# Patient Record
Sex: Male | Born: 1970 | Race: White | Hispanic: No | Marital: Married
Health system: Southern US, Community
[De-identification: ages and names within clinical notes are randomized; demographics above are authoritative.]

## PROBLEM LIST (undated history)

## (undated) DIAGNOSIS — B019 Varicella without complication: Secondary | ICD-10-CM

## (undated) HISTORY — PX: OTHER SURGICAL HISTORY: SHX169

## (undated) HISTORY — PX: KNEE ARTHROSCOPY: SHX127

## (undated) HISTORY — DX: Varicella without complication: B01.9

---
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
## 2018-01-23 ENCOUNTER — Ambulatory Visit: Payer: BC Managed Care – PPO | Admitting: Family Medicine

## 2018-01-23 ENCOUNTER — Encounter: Payer: Self-pay | Admitting: Family Medicine

## 2018-01-23 VITALS — BP 126/80 | HR 96 | Ht 67.0 in | Wt 213.0 lb

## 2018-01-23 DIAGNOSIS — D229 Melanocytic nevi, unspecified: Secondary | ICD-10-CM | POA: Diagnosis not present

## 2018-01-23 DIAGNOSIS — Z3009 Encounter for other general counseling and advice on contraception: Secondary | ICD-10-CM | POA: Insufficient documentation

## 2018-01-23 DIAGNOSIS — Z0001 Encounter for general adult medical examination with abnormal findings: Secondary | ICD-10-CM

## 2018-01-23 DIAGNOSIS — F5101 Primary insomnia: Secondary | ICD-10-CM | POA: Diagnosis not present

## 2018-01-23 DIAGNOSIS — Z125 Encounter for screening for malignant neoplasm of prostate: Secondary | ICD-10-CM | POA: Insufficient documentation

## 2018-01-23 DIAGNOSIS — Z0000 Encounter for general adult medical examination without abnormal findings: Secondary | ICD-10-CM | POA: Insufficient documentation

## 2018-01-23 DIAGNOSIS — Z1211 Encounter for screening for malignant neoplasm of colon: Secondary | ICD-10-CM | POA: Insufficient documentation

## 2018-01-23 NOTE — Progress Notes (Signed)
Subjective:  Patient ID: Richard Reynolds, male    DOB: 1970-12-16  Age: 47 y.o. MRN: [id number]  CC: Establish Care   HPI Richard Reynolds presents for establishment of care.  He is nonfasting today.  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  He lives with his wife and his 57 and5 year old children.  Oldest son is at we will studying drafting.  He is quite active he goes to the gym 5-6 days a week.  He does not smoke or use illicit drugs.  He drinks occasional beers on the weekends.  He uses trazodone 100 mg nightly for sleep.  This medicine is been quite effective for him.  He is taking it for some time now.  His father is 23 in relatively good health.  His father recently had a problem with his right eye patient is not sure what that is.  Patient's mother is in her 37s and has problems with being overweight.  Patient has a brother who is status post melanoma.  Patient has a past medical history of pyloric stenosis and so this is brother. History Richard Reynolds has no past medical history on file.   He has no past surgical history on file.   His family history is not on file.He reports that  has never smoked. he has never used smokeless tobacco. His alcohol and drug histories are not on file.  Outpatient Medications Prior to Visit  Medication Sig Dispense Refill  . traZODone (DESYREL) 100 MG tablet Take 1 tablet by mouth daily.     No facility-administered medications prior to visit.     ROS Review of Systems  Constitutional: Negative for chills, fatigue and fever.  HENT: Negative for congestion, postnasal drip and rhinorrhea.   Eyes: Negative for photophobia and visual disturbance.  Respiratory: Negative for cough and wheezing.   Cardiovascular: Negative.   Gastrointestinal: Negative.   Endocrine: Negative for polyphagia and polyuria.  Genitourinary: Negative.   Skin: Negative.   Allergic/Immunologic: Negative for immunocompromised state.  Neurological: Negative for  weakness and headaches.  Hematological: Does not bruise/bleed easily.  Psychiatric/Behavioral: Positive for sleep disturbance.    Objective:  BP 126/80 (BP Location: Left Arm, Patient Position: Sitting, Cuff Size: Normal)   Pulse 96   Ht 5\' 7"  (1.702 m)   Wt 213 lb (96.6 kg)   SpO2 96%   BMI 33.36 kg/m   Physical Exam  Constitutional: He appears well-developed and well-nourished. No distress.  HENT:  Head: Normocephalic and atraumatic.  Right Ear: External ear normal.  Left Ear: External ear normal.  Mouth/Throat: Oropharynx is clear and moist. No oropharyngeal exudate.  Eyes: Conjunctivae are normal. Pupils are equal, round, and reactive to light. Right eye exhibits no discharge. Left eye exhibits no discharge. No scleral icterus.  Neck: Neck supple. No JVD present. No tracheal deviation present. No thyromegaly present.  Cardiovascular: Normal rate, regular rhythm and normal heart sounds.  Pulmonary/Chest: Effort normal and breath sounds normal.  Abdominal: Soft. Bowel sounds are normal. He exhibits no distension. There is no hepatosplenomegaly, splenomegaly or hepatomegaly. There is no tenderness. There is no rebound, no guarding and no CVA tenderness. No hernia. Hernia confirmed negative in the ventral area, confirmed negative in the right inguinal area and confirmed negative in the left inguinal area.    Genitourinary: Testes normal and penis normal. Right testis shows no mass, no swelling and no tenderness. Right testis is descended. Left testis shows no mass, no swelling and no tenderness.  Left testis is descended. Circumcised. No hypospadias, penile erythema or penile tenderness. No discharge found.  Lymphadenopathy:    He has no cervical adenopathy.       Right: No inguinal adenopathy present.       Left: No inguinal adenopathy present.  Skin: Skin is warm and dry. He is not diaphoretic.  Psychiatric: He has a normal mood and affect. His behavior is normal.       Assessment & Plan:   Richard Reynolds was seen today for establish care.  Diagnoses and all orders for this visit:  Atypical mole -     Ambulatory referral to Dermatology  Primary insomnia  Encounter for health maintenance examination with abnormal findings -     CBC; Future -     Comprehensive metabolic panel; Future -     HIV antibody; Future -     Lipid panel; Future -     TSH; Future -     Urinalysis, Routine w reflex microscopic; Future   I am having Richard Reynolds maintain his traZODone.  No orders of the defined types were placed in this encounter.    Follow-up: Return in about 6 months (around 07/23/2018), or if symptoms worsen or fail to improve.  Libby Maw, MD

## 2018-01-23 NOTE — Patient Instructions (Addendum)
Insomnia Insomnia is a sleep disorder that makes it difficult to fall asleep or to stay asleep. Insomnia can cause tiredness (fatigue), low energy, difficulty concentrating, mood swings, and poor performance at work or school. There are three different ways to classify insomnia:  Difficulty falling asleep.  Difficulty staying asleep.  Waking up too early in the morning.  Any type of insomnia can be long-term (chronic) or short-term (acute). Both are common. Short-term insomnia usually lasts for three months or less. Chronic insomnia occurs at least three times a week for longer than three months. What are the causes? Insomnia may be caused by another condition, situation, or substance, such as:  Anxiety.  Certain medicines.  Gastroesophageal reflux disease (GERD) or other gastrointestinal conditions.  Asthma or other breathing conditions.  Restless legs syndrome, sleep apnea, or other sleep disorders.  Chronic pain.  Menopause. This may include hot flashes.  Stroke.  Abuse of alcohol, tobacco, or illegal drugs.  Depression.  Caffeine.  Neurological disorders, such as Alzheimer disease.  An overactive thyroid (hyperthyroidism).  The cause of insomnia may not be known. What increases the risk? Risk factors for insomnia include:  Gender. Women are more commonly affected than men.  Age. Insomnia is more common as you get older.  Stress. This may involve your professional or personal life.  Income. Insomnia is more common in people with lower income.  Lack of exercise.  Irregular work schedule or night shifts.  Traveling between different time zones.  What are the signs or symptoms? If you have insomnia, trouble falling asleep or trouble staying asleep is the main symptom. This may lead to other symptoms, such as:  Feeling fatigued.  Feeling nervous about going to sleep.  Not feeling rested in the morning.  Having trouble concentrating.  Feeling  irritable, anxious, or depressed.  How is this treated? Treatment for insomnia depends on the cause. If your insomnia is caused by an underlying condition, treatment will focus on addressing the condition. Treatment may also include:  Medicines to help you sleep.  Counseling or therapy.  Lifestyle adjustments.  Follow these instructions at home:  Take medicines only as directed by your health care provider.  Keep regular sleeping and waking hours. Avoid naps.  Keep a sleep diary to help you and your health care provider figure out what could be causing your insomnia. Include: ? When you sleep. ? When you wake up during the night. ? How well you sleep. ? How rested you feel the next day. ? Any side effects of medicines you are taking. ? What you eat and drink.  Make your bedroom a comfortable place where it is easy to fall asleep: ? Put up shades or special blackout curtains to block light from outside. ? Use a white noise machine to block noise. ? Keep the temperature cool.  Exercise regularly as directed by your health care provider. Avoid exercising right before bedtime.  Use relaxation techniques to manage stress. Ask your health care provider to suggest some techniques that may work well for you. These may include: ? Breathing exercises. ? Routines to release muscle tension. ? Visualizing peaceful scenes.  Cut back on alcohol, caffeinated beverages, and cigarettes, especially close to bedtime. These can disrupt your sleep.  Do not overeat or eat spicy foods right before bedtime. This can lead to digestive discomfort that can make it hard for you to sleep.  Limit screen use before bedtime. This includes: ? Watching TV. ? Using your smartphone, tablet, and   computer.  Stick to a routine. This can help you fall asleep faster. Try to do a quiet activity, brush your teeth, and go to bed at the same time each night.  Get out of bed if you are still awake after 15 minutes  of trying to sleep. Keep the lights down, but try reading or doing a quiet activity. When you feel sleepy, go back to bed.  Make sure that you drive carefully. Avoid driving if you feel very sleepy.  Keep all follow-up appointments as directed by your health care provider. This is important. Contact a health care provider if:  You are tired throughout the day or have trouble in your daily routine due to sleepiness.  You continue to have sleep problems or your sleep problems get worse. Get help right away if:  You have serious thoughts about hurting yourself or someone else. This information is not intended to replace advice given to you by your health care provider. Make sure you discuss any questions you have with your health care provider. Document Released: 11/17/2000 Document Revised: 04/21/2016 Document Reviewed: 08/21/2014 Elsevier Interactive Patient Education  2018 [employer]  Mole A mole is a colored (pigmented) growth on the skin. Moles are very common. They are usually harmless, but some moles can become cancerous over time. What are the causes? Moles occur when pigmented skin cells grow together in clusters instead of spreading out in the skin as they normally do. The reason why the skin cells grow together in clusters is not known. What are the signs or symptoms? A mole may be:  [place] or black.  Flat or raised.  Smooth or wrinkled.  How is this diagnosed? A mole is diagnosed with a skin exam. If your health care provider thinks a mole may be cancerous, a piece of the mole will be removed for testing. How is this treated? Treatment is not needed unless a mole is cancerous. If a mole is cancerous, it will be removed. If a mole is causing pain or you do not like the way it looks, you may choose to have it removed. Follow these instructions at home:  Every month, look for new moles and check your existing moles for changes. This is important because a change in a mole  can mean that the mole has become cancerous. Look for changes in: ? Size. Look for moles that are more than  in (0.64 cm) wide (in diameter). ? Shape. Look for moles that are not round or oval. ? Borders. Look for moles that are not symmetrical. ? Color. Note that it is normal for moles to get darker during pregnancy or when you take birth control pills.  When you are outdoors, wear sunscreen with SPF 30 (sun protection factor 30) or higher. Reapply the sunscreen every 2-3 hours.  If you have a large number of moles, see a skin doctor (dermatologist) at least one time every year. Contact a health care provider if:  The size, shape, borders, or color of your mole change.  Your mole, or the skin near the mole, becomes painful, sore, red, or swollen.  Your mole: ? Develops more than one color. ? Itches or bleeds. ? Becomes scaly, sheds skin, or oozes fluid. ? Becomes flat or develops raised areas. ? Becomes hard or soft.  You develop a new mole. This information is not intended to replace advice given to you by your health care provider. Make sure you discuss any questions you have with your  health care provider. Document Released: 08/15/2001 Document Revised: 05/03/2016 Document Reviewed: 09/10/2015 Elsevier Interactive Patient Education  [employer].

## 2018-01-24 ENCOUNTER — Other Ambulatory Visit (INDEPENDENT_AMBULATORY_CARE_PROVIDER_SITE_OTHER): Payer: BC Managed Care – PPO

## 2018-01-24 DIAGNOSIS — Z0001 Encounter for general adult medical examination with abnormal findings: Secondary | ICD-10-CM

## 2018-01-24 LAB — COMPREHENSIVE METABOLIC PANEL
ALBUMIN: 4.3 g/dL (ref 3.5–5.2)
ALT: 18 U/L (ref 0–53)
AST: 23 U/L (ref 0–37)
Alkaline Phosphatase: 51 U/L (ref 39–117)
BUN: 15 mg/dL (ref 6–23)
CHLORIDE: 105 meq/L (ref 96–112)
CO2: 31 meq/L (ref 19–32)
Calcium: 9.5 mg/dL (ref 8.4–10.5)
Creatinine, Ser: 0.93 mg/dL (ref 0.40–1.50)
GFR: 93.67 mL/min (ref >60.00)
Glucose, Bld: 110 mg/dL — ABNORMAL HIGH (ref 70–99)
POTASSIUM: 4.1 meq/L (ref 3.5–5.1)
SODIUM: 141 meq/L (ref 135–145)
Total Bilirubin: 0.7 mg/dL (ref 0.2–1.2)
Total Protein: 6.7 g/dL (ref 6.0–8.3)

## 2018-01-24 LAB — LIPID PANEL
Cholesterol: 171 mg/dL (ref 0–200)
HDL: 56.1 mg/dL (ref >39.00)
LDL Cholesterol: 103 mg/dL — ABNORMAL HIGH (ref 0–99)
NonHDL: 114.4
Total CHOL/HDL Ratio: 3
Triglycerides: 59 mg/dL (ref 0.0–149.0)
VLDL: 11.8 mg/dL (ref 0.0–40.0)

## 2018-01-24 LAB — CBC
HEMATOCRIT: 45.9 % (ref 39.0–52.0)
HEMOGLOBIN: 15.4 g/dL (ref 13.0–17.0)
MCHC: 33.6 g/dL (ref 30.0–36.0)
MCV: 85.9 fl (ref 78.0–100.0)
Platelets: 207 10*3/uL (ref 150.0–400.0)
RBC: 5.34 Mil/uL (ref 4.22–5.81)
RDW: 13.1 % (ref 11.5–15.5)
WBC: 4.7 10*3/uL (ref 4.0–10.5)

## 2018-01-24 LAB — TSH: TSH: 1.03 u[IU]/mL (ref 0.35–4.50)

## 2018-01-25 ENCOUNTER — Encounter: Payer: Self-pay | Admitting: Family Medicine

## 2018-01-25 LAB — HIV ANTIBODY (ROUTINE TESTING W REFLEX): HIV 1&2 Ab, 4th Generation: NONREACTIVE

## 2018-05-01 ENCOUNTER — Other Ambulatory Visit: Payer: Self-pay | Admitting: Family Medicine

## 2018-05-01 MED ORDER — TRAZODONE HCL 100 MG PO TABS: 100.0000 mg | ORAL_TABLET | Freq: Every day | ORAL | 1 refills | Status: DC

## 2018-05-01 NOTE — Telephone Encounter (Signed)
Rx sent in

## 2018-05-01 NOTE — Telephone Encounter (Signed)
Copied from [place] [phone]. Topic: Quick Communication - Rx Refill/Question >> May 01, 2018 11:04 AM Lennox Solders wrote: Medication: trazodone  Has the patient contacted their pharmacy?no  (Agent: If yes, when and what did the pharmacy advise?) this med was originally prescribed by his old md  Preferred Pharmacy (with phone number or street name): [facility] [address] in [place] Agent: Please be advised that RX refills may take up to 3 business days. We ask that you follow-up with your pharmacy.

## 2018-10-14 ENCOUNTER — Encounter: Payer: Self-pay | Admitting: Family Medicine

## 2018-10-14 ENCOUNTER — Ambulatory Visit: Payer: BC Managed Care – PPO | Admitting: Family Medicine

## 2018-10-14 VITALS — BP 120/78 | HR 67 | Ht 67.0 in | Wt 220.0 lb

## 2018-10-14 DIAGNOSIS — F5101 Primary insomnia: Secondary | ICD-10-CM

## 2018-10-14 DIAGNOSIS — E6609 Other obesity due to excess calories: Secondary | ICD-10-CM | POA: Insufficient documentation

## 2018-10-14 DIAGNOSIS — D229 Melanocytic nevi, unspecified: Secondary | ICD-10-CM

## 2018-10-14 DIAGNOSIS — Z6834 Body mass index (BMI) 34.0-34.9, adult: Secondary | ICD-10-CM | POA: Diagnosis not present

## 2018-10-14 MED ORDER — TRAZODONE HCL 100 MG PO TABS: 100.0000 mg | ORAL_TABLET | Freq: Every day | ORAL | 2 refills | Status: DC

## 2018-10-14 NOTE — Patient Instructions (Signed)
Calorie Counting for Weight Loss Calories are units of energy. Your body needs a certain amount of calories from food to keep you going throughout the day. When you eat more calories than your body needs, your body stores the extra calories as fat. When you eat fewer calories than your body needs, your body burns fat to get the energy it needs. Calorie counting means keeping track of how many calories you eat and drink each day. Calorie counting can be helpful if you need to lose weight. If you make sure to eat fewer calories than your body needs, you should lose weight. Ask your health care provider what a healthy weight is for you. For calorie counting to work, you will need to eat the right number of calories in a day in order to lose a healthy amount of weight per week. A dietitian can help you determine how many calories you need in a day and will give you suggestions on how to reach your calorie goal.  A healthy amount of weight to lose per week is usually 1-2 lb (0.5-0.9 kg). This usually means that your daily calorie intake should be reduced by 500-750 calories.  Eating 1,200 - 1,500 calories per day can help most women lose weight.  Eating 1,500 - 1,800 calories per day can help most men lose weight.  What is my plan? My goal is to have __________ calories per day. If I have this many calories per day, I should lose around __________ pounds per week. What do I need to know about calorie counting? In order to meet your daily calorie goal, you will need to:  Find out how many calories are in each food you would like to eat. Try to do this before you eat.  Decide how much of the food you plan to eat.  Write down what you ate and how many calories it had. Doing this is called keeping a food log.  To successfully lose weight, it is important to balance calorie counting with a healthy lifestyle that includes regular activity. Aim for 150 minutes of moderate exercise (such as walking) or 75  minutes of vigorous exercise (such as running) each week. Where do I find calorie information?  The number of calories in a food can be found on a Nutrition Facts label. If a food does not have a Nutrition Facts label, try to look up the calories online or ask your dietitian for help. Remember that calories are listed per serving. If you choose to have more than one serving of a food, you will have to multiply the calories per serving by the amount of servings you plan to eat. For example, the label on a package of bread might say that a serving size is 1 slice and that there are 90 calories in a serving. If you eat 1 slice, you will have eaten 90 calories. If you eat 2 slices, you will have eaten 180 calories. How do I keep a food log? Immediately after each meal, record the following information in your food log:  What you ate. Don't forget to include toppings, sauces, and other extras on the food.  How much you ate. This can be measured in cups, ounces, or number of items.  How many calories each food and drink had.  The total number of calories in the meal.  Keep your food log near you, such as in a small notebook in your pocket, or use a mobile app or website. Some   programs will calculate calories for you and show you how many calories you have left for the day to meet your goal. What are some calorie counting tips?  Use your calories on foods and drinks that will fill you up and not leave you hungry: ? Some examples of foods that fill you up are nuts and nut butters, vegetables, lean proteins, and high-fiber foods like whole grains. High-fiber foods are foods with more than 5 g fiber per serving. ? Drinks such as sodas, specialty coffee drinks, alcohol, and juices have a lot of calories, yet do not fill you up.  Eat nutritious foods and avoid empty calories. Empty calories are calories you get from foods or beverages that do not have many vitamins or protein, such as candy, sweets, and  soda. It is better to have a nutritious high-calorie food (such as an avocado) than a food with few nutrients (such as a bag of chips).  Know how many calories are in the foods you eat most often. This will help you calculate calorie counts faster.  Pay attention to calories in drinks. Low-calorie drinks include water and unsweetened drinks.  Pay attention to nutrition labels for "low fat" or "fat free" foods. These foods sometimes have the same amount of calories or more calories than the full fat versions. They also often have added sugar, starch, or salt, to make up for flavor that was removed with the fat.  Find a way of tracking calories that works for you. Get creative. Try different apps or programs if writing down calories does not work for you. What are some portion control tips?  Know how many calories are in a serving. This will help you know how many servings of a certain food you can have.  Use a measuring cup to measure serving sizes. You could also try weighing out portions on a kitchen scale. With time, you will be able to estimate serving sizes for some foods.  Take some time to put servings of different foods on your favorite plates, bowls, and cups so you know what a serving looks like.  Try not to eat straight from a bag or box. Doing this can lead to overeating. Put the amount you would like to eat in a cup or on a plate to make sure you are eating the right portion.  Use smaller plates, glasses, and bowls to prevent overeating.  Try not to multitask (for example, watch TV or use your computer) while eating. If it is time to eat, sit down at a table and enjoy your food. This will help you to know when you are full. It will also help you to be aware of what you are eating and how much you are eating. What are tips for following this plan? Reading food labels  Check the calorie count compared to the serving size. The serving size may be smaller than what you are used to  eating.  Check the source of the calories. Make sure the food you are eating is high in vitamins and protein and low in saturated and trans fats. Shopping  Read nutrition labels while you shop. This will help you make healthy decisions before you decide to purchase your food.  Make a grocery list and stick to it. Cooking  Try to cook your favorite foods in a healthier way. For example, try baking instead of frying.  Use low-fat dairy products. Meal planning  Use more fruits and vegetables. Half of your plate should   be fruits and vegetables.  Include lean proteins like poultry and fish. How do I count calories when eating out?  Ask for smaller portion sizes.  Consider sharing an entree and sides instead of getting your own entree.  If you get your own entree, eat only half. Ask for a box at the beginning of your meal and put the rest of your entree in it so you are not tempted to eat it.  If calories are listed on the menu, choose the lower calorie options.  Choose dishes that include vegetables, fruits, whole grains, low-fat dairy products, and lean protein.  Choose items that are boiled, broiled, grilled, or steamed. Stay away from items that are buttered, battered, fried, or served with cream sauce. Items labeled "crispy" are usually fried, unless stated otherwise.  Choose water, low-fat milk, unsweetened iced tea, or other drinks without added sugar. If you want an alcoholic beverage, choose a lower calorie option such as a glass of wine or light beer.  Ask for dressings, sauces, and syrups on the side. These are usually high in calories, so you should limit the amount you eat.  If you want a salad, choose a garden salad and ask for grilled meats. Avoid extra toppings like bacon, cheese, or fried items. Ask for the dressing on the side, or ask for olive oil and vinegar or lemon to use as dressing.  Estimate how many servings of a food you are given. For example, a serving of  cooked rice is  cup or about the size of half a baseball. Knowing serving sizes will help you be aware of how much food you are eating at restaurants. The list below tells you how big or small some common portion sizes are based on everyday objects: ? 1 oz-4 stacked dice. ? 3 oz-1 deck of cards. ? 1 tsp-1 die. ? 1 Tbsp- a ping-pong ball. ? 2 Tbsp-1 ping-pong ball. ?  cup- baseball. ? 1 cup-1 baseball. Summary  Calorie counting means keeping track of how many calories you eat and drink each day. If you eat fewer calories than your body needs, you should lose weight.  A healthy amount of weight to lose per week is usually 1-2 lb (0.5-0.9 kg). This usually means reducing your daily calorie intake by 500-750 calories.  The number of calories in a food can be found on a Nutrition Facts label. If a food does not have a Nutrition Facts label, try to look up the calories online or ask your dietitian for help.  Use your calories on foods and drinks that will fill you up, and not on foods and drinks that will leave you hungry.  Use smaller plates, glasses, and bowls to prevent overeating. This information is not intended to replace advice given to you by your health care provider. Make sure you discuss any questions you have with your health care provider. Document Released: 11/20/2005 Document Revised: 10/20/2016 Document Reviewed: 10/20/2016 Elsevier Interactive Patient Education  2018 [employer].  Preventing Type 2 Diabetes Mellitus Type 2 diabetes (type 2 diabetes mellitus) is a long-term (chronic) disease that affects blood sugar (glucose) levels. Normally, a hormone called insulin allows glucose to enter cells in the body. The cells use glucose for energy. In type 2 diabetes, one or both of these problems may be present:  The body does not make enough insulin.  The body does not respond properly to insulin that it makes (insulin resistance).  Insulin resistance or lack of insulin  causes excess glucose to build up in the blood instead of going into cells. As a result, high blood glucose (hyperglycemia) develops, which can cause many complications. Being overweight or obese and having an inactive (sedentary) lifestyle can increase your risk for diabetes. Type 2 diabetes can be delayed or prevented by making certain nutrition and lifestyle changes. What nutrition changes can be made?  Eat healthy meals and snacks regularly. Keep a healthy snack with you for when you get hungry between meals, such as fruit or a handful of nuts.  Eat lean meats and proteins that are low in saturated fats, such as chicken, fish, egg whites, and beans. Avoid processed meats.  Eat plenty of fruits and vegetables and plenty of grains that have not been processed (whole grains). It is recommended that you eat: ? 1?2 cups of fruit every day. ? 2?3 cups of vegetables every day. ? 6?8 oz of whole grains every day, such as oats, whole wheat, bulgur, brown rice, quinoa, and millet.  Eat low-fat dairy products, such as milk, yogurt, and cheese.  Eat foods that contain healthy fats, such as nuts, avocado, olive oil, and canola oil.  Drink water throughout the day. Avoid drinks that contain added sugar, such as soda or sweet tea.  Follow instructions from your health care provider about specific eating or drinking restrictions.  Control how much food you eat at a time (portion size). ? Check food labels to find out the serving sizes of foods. ? Use a kitchen scale to weigh amounts of foods.  Saute or steam food instead of frying it. Cook with water or broth instead of oils or butter.  Limit your intake of: ? Salt (sodium). Have no more than 1 tsp (2,400 mg) of sodium a day. If you have heart disease or high blood pressure, have less than ? tsp (1,500 mg) of sodium a day. ? Saturated fat. This is fat that is solid at room temperature, such as butter or fat on meat. What lifestyle changes can be  made?  Activity  Do moderate-intensity physical activity for at least 30 minutes on at least 5 days of the week, or as much as told by your health care provider.  Ask your health care provider what activities are safe for you. A mix of physical activities may be best, such as walking, swimming, cycling, and strength training.  Try to add physical activity into your day. For example: ? Park in spots that are farther away than usual, so that you walk more. For example, park in a far corner of the parking lot when you go to the office or the grocery store. ? Take a walk during your lunch break. ? Use stairs instead of elevators or escalators. Weight Loss  Lose weight as directed. Your health care provider can determine how much weight loss is best for you and can help you lose weight safely.  If you are overweight or obese, you may be instructed to lose at least 5?7 % of your body weight. Alcohol and Tobacco   Limit alcohol intake to no more than 1 drink a day for nonpregnant women and 2 drinks a day for men. One drink equals 12 oz of beer, 5 oz of wine, or 1 oz of hard liquor.  Do not use any tobacco products, such as cigarettes, chewing tobacco, and e-cigarettes. If you need help quitting, ask your health care provider. Work With [place] Provider  Have your blood glucose tested regularly,  as told by your health care provider.  Discuss your risk factors and how you can reduce your risk for diabetes.  Get screening tests as told by your health care provider. You may have screening tests regularly, especially if you have certain risk factors for type 2 diabetes.  Make an appointment with a diet and nutrition specialist (registered dietitian). A registered dietitian can help you make a healthy eating plan and can help you understand portion sizes and food labels. Why are these changes important?  It is possible to prevent or delay type 2 diabetes and related health problems by  making lifestyle and nutrition changes.  It can be difficult to recognize signs of type 2 diabetes. The best way to avoid possible damage to your body is to take actions to prevent the disease before you develop symptoms. What can happen if changes are not made?  Your blood glucose levels may keep increasing. Having high blood glucose for a long time is dangerous. Too much glucose in your blood can damage your blood vessels, heart, kidneys, nerves, and eyes.  You may develop prediabetes or type 2 diabetes. Type 2 diabetes can lead to many chronic health problems and complications, such as: ? Heart disease. ? Stroke. ? Blindness. ? Kidney disease. ? Depression. ? Poor circulation in the feet and legs, which could lead to surgical removal (amputation) in severe cases. Where to find support:  Ask your health care provider to recommend a registered dietitian, diabetes educator, or weight loss program.  Look for local or online weight loss groups.  Join a gym, fitness club, or outdoor activity group, such as a walking club. Where to find more information: To learn more about diabetes and diabetes prevention, visit:  American Diabetes Association (ADA): www.diabetes.[employer] of Diabetes and Digestive and Kidney Diseases: FindSpin.nl  To learn more about healthy eating, visit:  The U.S. Department of Agriculture Scientist, research (physical sciences)), Choose My Plate: http://wiley-williams.com/  Office of Disease Prevention and Health Promotion (ODPHP), Dietary Guidelines: SurferLive.at  Summary  You can reduce your risk for type 2 diabetes by increasing your physical activity, eating healthy foods, and losing weight as directed.  Talk with your health care provider about your risk for type 2 diabetes. Ask about any blood tests or screening tests that you need to have. This information is not intended to replace advice given to you by your  health care provider. Make sure you discuss any questions you have with your health care provider. Document Released: 03/13/2016 Document Revised: 04/27/2016 Document Reviewed: 01/11/2016 Elsevier Interactive Patient Education  [employer].

## 2018-10-14 NOTE — Progress Notes (Signed)
Subjective:  Patient ID: Richard Reynolds, male    DOB: 08-03-1971  Age: 47 y.o. MRN: [id number]  CC: Follow-up   HPI Navdeep Halt presents for follow-up of his insomnia.  Patient continues to use the trazodone on a regular basis.  He takes the entire 100 mg pill.  Medication remains highly effective for him.  Expresses some frustration of her weight gain especially when he is going to the gym regularly.  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  He is married with 69 and 47 year old sons.  24 year old is planning on attending [employer].  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  Outpatient Medications Prior to Visit  Medication Sig Dispense Refill  . traZODone (DESYREL) 100 MG tablet Take 1 tablet (100 mg total) by mouth daily. 90 tablet 1   No facility-administered medications prior to visit.     ROS Review of Systems  Constitutional: Negative.   Eyes: Negative.   Respiratory: Negative.   Cardiovascular: Negative.   Gastrointestinal: Negative.   Skin: Positive for color change.  Neurological: Negative.   Hematological: Negative.   Psychiatric/Behavioral: Positive for sleep disturbance. Negative for behavioral problems and dysphoric mood. The patient is not nervous/anxious.     Objective:  BP 120/78 (BP Location: Left Arm, Patient Position: Sitting, Cuff Size: Normal)   Pulse 67   Ht 5\' 7"  (1.702 m)   Wt 220 lb (99.8 kg)   SpO2 95%   BMI 34.46 kg/m   BP Readings from Last 3 Encounters:  10/14/18 120/78  01/23/18 126/80    Wt Readings from Last 3 Encounters:  10/14/18 220 lb (99.8 kg)  01/23/18 213 lb (96.6 kg)    Physical Exam  Constitutional: He is oriented to person, place, and time. He appears well-developed and well-nourished. No distress.  HENT:  Head: Normocephalic and atraumatic.  Right Ear: External ear normal.  Left Ear: External ear  normal.  Mouth/Throat: Oropharynx is clear and moist. No oropharyngeal exudate.  Eyes: Pupils are equal, round, and reactive to light. Conjunctivae and EOM are normal. Right eye exhibits no discharge. Left eye exhibits no discharge.  Neck: Neck supple. No JVD present. No tracheal deviation present. No thyromegaly present.  Cardiovascular: Normal rate, regular rhythm and normal heart sounds.  Pulmonary/Chest: Effort normal and breath sounds normal.  Lymphadenopathy:    He has no cervical adenopathy.  Neurological: He is alert and oriented to person, place, and time.  Skin: Skin is warm and dry.     Psychiatric: He has a normal mood and affect. His behavior is normal.    Lab Results  Component Value Date   WBC 4.7 01/24/2018   HGB 15.4 01/24/2018   HCT 45.9 01/24/2018   PLT 207.0 01/24/2018   GLUCOSE 110 (H) 01/24/2018   CHOL 171 01/24/2018   TRIG 59.0 01/24/2018   HDL 56.10 01/24/2018   LDLCALC 103 (H) 01/24/2018   ALT 18 01/24/2018   AST 23 01/24/2018   NA 141 01/24/2018   K 4.1 01/24/2018   CL 105 01/24/2018   CREATININE 0.93 01/24/2018   BUN 15 01/24/2018   CO2 31 01/24/2018   TSH 1.03 01/24/2018    Patient was never admitted.  Assessment & Plan:   King was seen today for follow-up.  Diagnoses and all orders for this visit:  Primary insomnia -     traZODone (DESYREL) 100 MG tablet; Take 1 tablet (100  mg total) by mouth daily.  Class 1 obesity due to excess calories without serious comorbidity with body mass index (BMI) of 34.0 to 34.9 in adult   I am having Christop Kucharski maintain his traZODone.  Meds ordered this encounter  Medications  . traZODone (DESYREL) 100 MG tablet    Sig: Take 1 tablet (100 mg total) by mouth daily.    Dispense:  90 tablet    Refill:  2   Patient is follow-up with dermatologist on a yearly basis.  We discussed seeing the nutritionist.  He will consider this and get back to me on that.  He was given information on calorie  counting to lose weight.  We did discuss his elevated elevated blood sugar and its implication for developing diabetes.  He is aware that weight loss would also help the situation.  Continue trazodone and follow-up in 6 months or sooner as needed.  Follow-up: Return in about 6 months (around 04/14/2019).  Libby Maw, MD

## 2019-04-14 ENCOUNTER — Encounter: Payer: Self-pay | Admitting: Family Medicine

## 2019-04-14 ENCOUNTER — Ambulatory Visit: Payer: BC Managed Care – PPO | Admitting: Family Medicine

## 2019-04-14 ENCOUNTER — Ambulatory Visit (INDEPENDENT_AMBULATORY_CARE_PROVIDER_SITE_OTHER): Payer: BC Managed Care – PPO | Admitting: Family Medicine

## 2019-04-14 VITALS — Ht 67.0 in | Wt 212.0 lb

## 2019-04-14 DIAGNOSIS — Z6834 Body mass index (BMI) 34.0-34.9, adult: Secondary | ICD-10-CM

## 2019-04-14 DIAGNOSIS — E6609 Other obesity due to excess calories: Secondary | ICD-10-CM

## 2019-04-14 DIAGNOSIS — Z3009 Encounter for other general counseling and advice on contraception: Secondary | ICD-10-CM | POA: Diagnosis not present

## 2019-04-14 DIAGNOSIS — F5101 Primary insomnia: Secondary | ICD-10-CM

## 2019-04-14 DIAGNOSIS — S86912A Strain of unspecified muscle(s) and tendon(s) at lower leg level, left leg, initial encounter: Secondary | ICD-10-CM | POA: Diagnosis not present

## 2019-04-14 MED ORDER — TRAZODONE HCL 100 MG PO TABS: 100.0000 mg | ORAL_TABLET | Freq: Every evening | ORAL | 1 refills | Status: DC | PRN

## 2019-04-14 NOTE — Progress Notes (Signed)
Virtual Visit via Video Note  I connected with Richard Reynolds on 04/14/19 at 10:00 AM EDT by a video enabled telemedicine application and verified that I am speaking with the correct person using two identifiers.  Location: Patient: home Provider:    I discussed the limitations of evaluation and management by telemedicine and the availability of in person appointments. The patient expressed understanding and agreed to proceed.  History of Present Illness:    Observations/Objective:   Assessment and Plan:   Follow Up Instructions:    I discussed the assessment and treatment plan with the patient. The patient was provided an opportunity to ask questions and all were answered. The patient agreed with the plan and demonstrated an understanding of the instructions.   The patient was advised to call back or seek an in-person evaluation if the symptoms worsen or if the condition fails to improve as anticipated.  I provided 20   Established Patient Office Visit  Subjective:  Patient ID: Richard Reynolds, male    DOB: 1971-01-20  Age: 48 y.o. MRN: [id number]  CC:  Chief Complaint  Patient presents with  . Follow-up    HPI Richard Reynolds presents for follow-up of his insomnia that continues to be well controlled with trazodone 100 mg that he is taking on a as needed basis.  He has been able to lose some weight.  He is down to 212.  Continues to exercise by walking and jogging and has an exercise routine.  Unfortunately after jogging 1 day he developed acute pain and swelling in his left knee.  He has been treating with Tylenol and ibuprofen and cold packs.  Has no injury history to this knee that he can recall.  He has had 2 surgeries on the right knee.  He would like to have a vasectomy.  He and his wife are through with procreation.  Requests referral to [facility].  Past Medical History:  Diagnosis Date  . Chicken pox     History reviewed. No pertinent surgical history.  History  reviewed. No pertinent family history.  Social History   Socioeconomic History  . Marital status: Married    Spouse name: Not on file  . Number of children: Not on file  . Years of education: Not on file  . Highest education level: Not on file  Occupational History  . Not on file  Social Needs  . Financial resource strain: Not on file  . Food insecurity:    Worry: Not on file    Inability: Not on file  . Transportation needs:    Medical: Not on file    Non-medical: Not on file  Tobacco Use  . Smoking status: Never Smoker  . Smokeless tobacco: Never Used  Substance and Sexual Activity  . Alcohol use: Not on file  . Drug use: Not on file  . Sexual activity: Not on file  Lifestyle  . Physical activity:    Days per week: Not on file    Minutes per session: Not on file  . Stress: Not on file  Relationships  . Social connections:    Talks on phone: Not on file    Gets together: Not on file    Attends religious service: Not on file    Active member of club or organization: Not on file    Attends meetings of clubs or organizations: Not on file    Relationship status: Not on file  . Intimate partner violence:    Fear of  current or ex partner: Not on file    Emotionally abused: Not on file    Physically abused: Not on file    Forced sexual activity: Not on file  Other Topics Concern  . Not on file  Social History Narrative  . Not on file    Outpatient Medications Prior to Visit  Medication Sig Dispense Refill  . traZODone (DESYREL) 100 MG tablet Take 1 tablet (100 mg total) by mouth daily. 90 tablet 2   No facility-administered medications prior to visit.     Not on File  ROS Review of Systems  Constitutional: Negative.   Respiratory: Negative.   Cardiovascular: Negative.   Gastrointestinal: Negative.   Musculoskeletal: Positive for arthralgias, gait problem and joint swelling.  Hematological: Negative.   Psychiatric/Behavioral: Positive for sleep  disturbance.      Objective:    Physical Exam  Constitutional: He is oriented to person, place, and time. He appears well-developed and well-nourished. No distress.  HENT:  Head: Normocephalic and atraumatic.  Right Ear: External ear normal.  Left Ear: External ear normal.  Eyes: Conjunctivae are normal. Right eye exhibits no discharge. Left eye exhibits no discharge.  Neck: No JVD present. No tracheal deviation present.  Pulmonary/Chest: Effort normal. No stridor.  Neurological: He is alert and oriented to person, place, and time.  Skin: He is not diaphoretic.  Psychiatric: He has a normal mood and affect. His behavior is normal.    Ht 5\' 7"  (1.702 m)   Wt 212 lb (96.2 kg)   BMI 33.20 kg/m  Wt Readings from Last 3 Encounters:  04/14/19 212 lb (96.2 kg)  10/14/18 220 lb (99.8 kg)  01/23/18 213 lb (96.6 kg)     Health Maintenance Due  Topic Date Due  . TETANUS/TDAP  09/22/1992    There are no preventive care reminders to display for this patient.  Lab Results  Component Value Date   TSH 1.03 01/24/2018   Lab Results  Component Value Date   WBC 4.7 01/24/2018   HGB 15.4 01/24/2018   HCT 45.9 01/24/2018   MCV 85.9 01/24/2018   PLT 207.0 01/24/2018   Lab Results  Component Value Date   NA 141 01/24/2018   K 4.1 01/24/2018   CO2 31 01/24/2018   GLUCOSE 110 (H) 01/24/2018   BUN 15 01/24/2018   CREATININE 0.93 01/24/2018   BILITOT 0.7 01/24/2018   ALKPHOS 51 01/24/2018   AST 23 01/24/2018   ALT 18 01/24/2018   PROT 6.7 01/24/2018   ALBUMIN 4.3 01/24/2018   CALCIUM 9.5 01/24/2018   GFR 93.67 01/24/2018   Lab Results  Component Value Date   CHOL 171 01/24/2018   Lab Results  Component Value Date   HDL 56.10 01/24/2018   Lab Results  Component Value Date   LDLCALC 103 (H) 01/24/2018   Lab Results  Component Value Date   TRIG 59.0 01/24/2018   Lab Results  Component Value Date   CHOLHDL 3 01/24/2018   No results found for: HGBA1C     Assessment & Plan:   Problem List Items Addressed This Visit      Other   Primary insomnia - Primary   Relevant Medications   traZODone (DESYREL) 100 MG tablet   Encounter for evaluation regarding contraception options   Relevant Orders   Ambulatory referral to Urology   Strain of left knee   Relevant Orders   Ambulatory referral to Sports Medicine      Meds ordered this  encounter  Medications  . traZODone (DESYREL) 100 MG tablet    Sig: Take 1 tablet (100 mg total) by mouth at bedtime as needed for sleep.    Dispense:  90 tablet    Refill:  1    Follow-up: No follow-ups on file.    Libby Maw, MD minutes of non-face-to-face time during this encounter.  Patient is to follow-up in the fall for physical exam or on an as-needed basis.

## 2019-04-16 ENCOUNTER — Ambulatory Visit: Payer: BC Managed Care – PPO | Admitting: Family Medicine

## 2019-04-16 ENCOUNTER — Encounter: Payer: Self-pay | Admitting: Family Medicine

## 2019-04-16 ENCOUNTER — Ambulatory Visit (INDEPENDENT_AMBULATORY_CARE_PROVIDER_SITE_OTHER): Payer: BC Managed Care – PPO

## 2019-04-16 VITALS — BP 138/78 | HR 66 | Temp 98.1°F | Ht 67.0 in

## 2019-04-16 DIAGNOSIS — M25561 Pain in right knee: Secondary | ICD-10-CM

## 2019-04-16 MED ORDER — DICLOFENAC SODIUM 2 % TD SOLN: 1.0000 "application " | Freq: Two times a day (BID) | TRANSDERMAL | 3 refills | Status: DC

## 2019-04-16 NOTE — Progress Notes (Signed)
Richard Reynolds - 48 y.o. male MRN [id number]  Date of birth: Apr 18, 1971  SUBJECTIVE:  Including CC & ROS.  Chief Complaint  Patient presents with  . Pain    right knee pain/ 2 weeks/ went on jog could hardly walk after    Richard Reynolds is a 48 y.o. male that is presenting with right knee pain. The pain is sharp and throbbing. The pain was worse from jogging. The pain started two weeks ago. No history of surgery. Felt like he turned his knee when he stepped off a deck a few weeks ago. No mechanical symptoms. Has not tried any modalities. Pain is intermittent and moderate.    Review of Systems  Constitutional: Negative for fever.  HENT: Negative for congestion.   Respiratory: Negative for cough.   Cardiovascular: Negative for chest pain.  Gastrointestinal: Negative for abdominal pain.  Musculoskeletal: Negative for back pain.  Skin: Negative for color change.  Neurological: Negative for weakness.  Hematological: Negative for adenopathy.    HISTORY: Past Medical, Surgical, Social, and Family History Reviewed & Updated per EMR.   Pertinent Historical Findings include:  Past Medical History:  Diagnosis Date  . Chicken pox     No past surgical history on file.  Not on File  No family history on file.   Social History   Socioeconomic History  . Marital status: Married    Spouse name: Not on file  . Number of children: Not on file  . Years of education: Not on file  . Highest education level: Not on file  Occupational History  . Not on file  Social Needs  . Financial resource strain: Not on file  . Food insecurity:    Worry: Not on file    Inability: Not on file  . Transportation needs:    Medical: Not on file    Non-medical: Not on file  Tobacco Use  . Smoking status: Never Smoker  . Smokeless tobacco: Never Used  Substance and Sexual Activity  . Alcohol use: Not on file  . Drug use: Not on file  . Sexual activity: Not on file  Lifestyle  . Physical activity:     Days per week: Not on file    Minutes per session: Not on file  . Stress: Not on file  Relationships  . Social connections:    Talks on phone: Not on file    Gets together: Not on file    Attends religious service: Not on file    Active member of club or organization: Not on file    Attends meetings of clubs or organizations: Not on file    Relationship status: Not on file  . Intimate partner violence:    Fear of current or ex partner: Not on file    Emotionally abused: Not on file    Physically abused: Not on file    Forced sexual activity: Not on file  Other Topics Concern  . Not on file  Social History Narrative  . Not on file     PHYSICAL EXAM:  VS: BP 138/78   Pulse 66   Temp 98.1 F (36.7 C) (Oral)   Ht 5\' 7"  (1.702 m)   SpO2 95%   BMI 33.20 kg/m  Physical Exam Gen: NAD, alert, cooperative with exam, well-appearing ENT: normal lips, normal nasal mucosa,  Eye: normal EOM, normal conjunctiva and lids CV:  no edema, +2 pedal pulses   Resp: no accessory muscle use, non-labored,  Skin: no rashes,  no areas of induration  Neuro: normal tone, normal sensation to touch Psych:  normal insight, alert and oriented MSK:  Right Knee: Normal to inspection with no erythema or effusion or obvious bony abnormalities. Palpation normal with no warmth, joint line tenderness, patellar tenderness, or condyle tenderness. ROM full in flexion and extension and lower leg rotation. Ligaments with solid consistent endpoints including  LCL, MCL. Positive Mcmurray's  tests. Non painful patellar compression. Patellar glide without crepitus. Patellar and quadriceps tendons unremarkable. Hamstring and quadriceps strength is normal.  neurovascularly intact    .Limited ultrasound: right knee:  Mild effusion within the suprapatellar pouch. Normal joint space of the medial joint line.  Hypoechoic lucency within the medial meniscus to suggest a degenerative change. Normal-appearing  lateral joint space and meniscus  Summary: degenerative changes of the medial meniscus   Ultrasound and interpretation by Clearance Coots, MD     ASSESSMENT & PLAN:   Acute pain of right knee Symptoms seem suggestive of an irritation of a degenerative meniscus.  No significant effusion and no significant degenerative changes. -Pennsaid. -Counseled on home exercise therapy and supportive care. -If no improvement will consider imaging or injection.

## 2019-04-16 NOTE — Assessment & Plan Note (Signed)
Symptoms seem suggestive of an irritation of a degenerative meniscus.  No significant effusion and no significant degenerative changes. -Pennsaid. -Counseled on home exercise therapy and supportive care. -If no improvement will consider imaging or injection.

## 2019-04-16 NOTE — Patient Instructions (Addendum)
Nice to meet you  Please try the rub on the medicine  Please try the exercises in 2-4 weeks  Please try ice on the knee  Please send me a message on MyChart with any questions or updates.  Please see me back in one month if no better.

## 2019-05-15 ENCOUNTER — Telehealth: Payer: Self-pay

## 2019-05-15 ENCOUNTER — Ambulatory Visit (INDEPENDENT_AMBULATORY_CARE_PROVIDER_SITE_OTHER): Payer: BC Managed Care – PPO | Admitting: Family Medicine

## 2019-05-15 ENCOUNTER — Encounter: Payer: Self-pay | Admitting: Family Medicine

## 2019-05-15 DIAGNOSIS — F4320 Adjustment disorder, unspecified: Secondary | ICD-10-CM | POA: Diagnosis not present

## 2019-05-15 NOTE — Telephone Encounter (Signed)
Copied from [employer] [id number]. Topic: Appointment Scheduling - Scheduling Inquiry for Clinic >> May 14, 2019  4:03 PM Virl Axe D wrote: Reason for CRM: Pt called to schedule appt with Dr. Ethelene Hal as soon as possible. Office closed. Please return call. [phone]

## 2019-05-15 NOTE — Progress Notes (Signed)
Established Patient Office Visit  Subjective:  Patient ID: Richard Reynolds, male    DOB: 1971/03/12  Age: 48 y.o. MRN: [id number]  CC:  Chief Complaint  Patient presents with  . Follow-up    HPI Richard Reynolds presents for a discussion about some irritability he has been experiencing at home with his loved ones.  There have been some arguments with family members that he feels have gotten out of hand.  He has felt some difficulty letting go.  Admits to increased family stress due to the change in family dynamics brought about by COVID restrictions.  He is at home and his wife is working.  Prior to that she had often traveled and out the house for up to a week at a time.  Continues to sleep well with the trazodone.  Denies depression or feelings of inadequacy.  Denies difficulty with focus but his wife thinks that that may be an issue for him.  Does not smoke or use illicit drugs.  Occasional beers on the weekend.  Scheduled for consultation for vasectomy next week.  Just getting back into exercising again.  Past Medical History:  Diagnosis Date  . Chicken pox     History reviewed. No pertinent surgical history.  History reviewed. No pertinent family history.  Social History   Socioeconomic History  . Marital status: Married    Spouse name: Not on file  . Number of children: Not on file  . Years of education: Not on file  . Highest education level: Not on file  Occupational History  . Not on file  Social Needs  . Financial resource strain: Not on file  . Food insecurity    Worry: Not on file    Inability: Not on file  . Transportation needs    Medical: Not on file    Non-medical: Not on file  Tobacco Use  . Smoking status: Never Smoker  . Smokeless tobacco: Never Used  Substance and Sexual Activity  . Alcohol use: Yes    Comment: occassional beers on the weekends.  . Drug use: Never  . Sexual activity: Not on file  Lifestyle  . Physical activity    Days per week: Not  on file    Minutes per session: Not on file  . Stress: Not on file  Relationships  . Social Herbalist on phone: Not on file    Gets together: Not on file    Attends religious service: Not on file    Active member of club or organization: Not on file    Attends meetings of clubs or organizations: Not on file    Relationship status: Not on file  . Intimate partner violence    Fear of current or ex partner: Not on file    Emotionally abused: Not on file    Physically abused: Not on file    Forced sexual activity: Not on file  Other Topics Concern  . Not on file  Social History Narrative  . Not on file    Outpatient Medications Prior to Visit  Medication Sig Dispense Refill  . Diclofenac Sodium (PENNSAID) 2 % SOLN Place 1 application onto the skin 2 (two) times daily. 1 Bottle 3  . traZODone (DESYREL) 100 MG tablet Take 1 tablet (100 mg total) by mouth at bedtime as needed for sleep. 90 tablet 1   No facility-administered medications prior to visit.     Not on File  ROS Review of Systems  Constitutional: Negative for chills, diaphoresis, fatigue, fever and unexpected weight change.  Respiratory: Negative.   Cardiovascular: Negative.   Gastrointestinal: Negative.   Psychiatric/Behavioral: Negative for dysphoric mood, self-injury and suicidal ideas. The patient is not nervous/anxious.       Objective:    Physical Exam  Constitutional: He is oriented to person, place, and time. He appears well-developed and well-nourished. No distress.  HENT:  Head: Normocephalic and atraumatic.  Right Ear: External ear normal.  Left Ear: External ear normal.  Pulmonary/Chest: Effort normal.  Neurological: He is alert and oriented to person, place, and time.  Skin: He is not diaphoretic.  Psychiatric: He has a normal mood and affect. His behavior is normal. Thought content normal.    There were no vitals taken for this visit. Wt Readings from Last 3 Encounters:  04/14/19  212 lb (96.2 kg)  10/14/18 220 lb (99.8 kg)  01/23/18 213 lb (96.6 kg)   BP Readings from Last 3 Encounters:  04/16/19 138/78  10/14/18 120/78  01/23/18 126/80   Guideline developer:  UpToDate (see UpToDate for funding source) Date Released: June 2014  Health Maintenance Due  Topic Date Due  . Samul Dada  09/22/1992    There are no preventive care reminders to display for this patient.  Lab Results  Component Value Date   TSH 1.03 01/24/2018   Lab Results  Component Value Date   WBC 4.7 01/24/2018   HGB 15.4 01/24/2018   HCT 45.9 01/24/2018   MCV 85.9 01/24/2018   PLT 207.0 01/24/2018   Lab Results  Component Value Date   NA 141 01/24/2018   K 4.1 01/24/2018   CO2 31 01/24/2018   GLUCOSE 110 (H) 01/24/2018   BUN 15 01/24/2018   CREATININE 0.93 01/24/2018   BILITOT 0.7 01/24/2018   ALKPHOS 51 01/24/2018   AST 23 01/24/2018   ALT 18 01/24/2018   PROT 6.7 01/24/2018   ALBUMIN 4.3 01/24/2018   CALCIUM 9.5 01/24/2018   GFR 93.67 01/24/2018   Lab Results  Component Value Date   CHOL 171 01/24/2018   Lab Results  Component Value Date   HDL 56.10 01/24/2018   Lab Results  Component Value Date   LDLCALC 103 (H) 01/24/2018   Lab Results  Component Value Date   TRIG 59.0 01/24/2018   Lab Results  Component Value Date   CHOLHDL 3 01/24/2018   No results found for: HGBA1C    Assessment & Plan:   Problem List Items Addressed This Visit    None    Visit Diagnoses    Adjustment disorder, unspecified type    -  Primary   Relevant Orders   Ambulatory referral to Psychology     Virtual Visit via Video Note  I connected with Richard Reynolds on 05/15/19 at  9:00 AM EDT by a video enabled telemedicine application and verified that I am speaking with the correct person using two identifiers.  Location: Patient: home Provider:    I discussed the limitations of evaluation and management by telemedicine and the availability of in person appointments. The  patient expressed understanding and agreed to proceed.  History of Present Illness:    Observations/Objective:   Assessment and Plan:   Follow Up Instructions:    I discussed the assessment and treatment plan with the patient. The patient was provided an opportunity to ask questions and all were answered. The patient agreed with the plan and demonstrated an understanding of the instructions.   The patient was  advised to call back or seek an in-person evaluation if the symptoms worsen or if the condition fails to improve as anticipated.  I provided 22 minutes of non-face-to-face time during this encounter.   Libby Maw, MD  No orders of the defined types were placed in this encounter.   Follow-up: Return if symptoms worsen or fail to improve.    Agree that counseling could be beneficial for him.

## 2019-05-15 NOTE — Telephone Encounter (Signed)
Done

## 2019-05-21 ENCOUNTER — Ambulatory Visit (INDEPENDENT_AMBULATORY_CARE_PROVIDER_SITE_OTHER): Payer: BC Managed Care – PPO | Admitting: Psychology

## 2019-05-21 DIAGNOSIS — F4321 Adjustment disorder with depressed mood: Secondary | ICD-10-CM | POA: Diagnosis not present

## 2019-06-10 ENCOUNTER — Ambulatory Visit (INDEPENDENT_AMBULATORY_CARE_PROVIDER_SITE_OTHER): Payer: BC Managed Care – PPO | Admitting: Psychology

## 2019-06-10 DIAGNOSIS — F4321 Adjustment disorder with depressed mood: Secondary | ICD-10-CM | POA: Diagnosis not present

## 2019-07-01 ENCOUNTER — Ambulatory Visit (INDEPENDENT_AMBULATORY_CARE_PROVIDER_SITE_OTHER): Payer: BC Managed Care – PPO | Admitting: Psychology

## 2019-07-01 DIAGNOSIS — F4321 Adjustment disorder with depressed mood: Secondary | ICD-10-CM

## 2019-07-29 ENCOUNTER — Ambulatory Visit (INDEPENDENT_AMBULATORY_CARE_PROVIDER_SITE_OTHER): Payer: BC Managed Care – PPO | Admitting: Psychology

## 2019-07-29 DIAGNOSIS — F4321 Adjustment disorder with depressed mood: Secondary | ICD-10-CM | POA: Diagnosis not present

## 2019-09-30 ENCOUNTER — Ambulatory Visit (INDEPENDENT_AMBULATORY_CARE_PROVIDER_SITE_OTHER): Payer: BC Managed Care – PPO | Admitting: Psychology

## 2019-09-30 DIAGNOSIS — F4321 Adjustment disorder with depressed mood: Secondary | ICD-10-CM

## 2019-10-01 ENCOUNTER — Other Ambulatory Visit: Payer: Self-pay

## 2019-10-01 ENCOUNTER — Telehealth: Payer: Self-pay

## 2019-10-01 DIAGNOSIS — S86912A Strain of unspecified muscle(s) and tendon(s) at lower leg level, left leg, initial encounter: Secondary | ICD-10-CM

## 2019-10-01 NOTE — Telephone Encounter (Signed)
Copied from [place] [phone]. Topic: General - Other >> Oct 01, 2019  2:36 PM Celene Kras A wrote: Reason for CRM: Pt called stating he is needing a referral to orthopedic doctor. Pt states his knee is getting worse. Please advise.

## 2019-10-06 ENCOUNTER — Ambulatory Visit: Payer: BC Managed Care – PPO | Admitting: Family Medicine

## 2019-10-06 ENCOUNTER — Encounter: Payer: Self-pay | Admitting: Family Medicine

## 2019-10-06 ENCOUNTER — Other Ambulatory Visit: Payer: Self-pay

## 2019-10-06 DIAGNOSIS — G8929 Other chronic pain: Secondary | ICD-10-CM

## 2019-10-06 DIAGNOSIS — M25561 Pain in right knee: Secondary | ICD-10-CM | POA: Diagnosis not present

## 2019-10-06 MED ORDER — MELOXICAM 15 MG PO TABS: 7.5000 mg | ORAL_TABLET | Freq: Every day | ORAL | 6 refills | Status: DC | PRN

## 2019-10-06 NOTE — Patient Instructions (Signed)
   Diagnosis:  Possible meniscus tear.  Options:   - A one-time cortisone injection.  - X-Rays and MRI scan to determine cause of pain, followed by surgical consult if meniscus tear confirmed.

## 2019-10-06 NOTE — Progress Notes (Signed)
Office Visit Note   Patient: Richard Reynolds           Date of Birth: 08-Feb-1971           MRN: [id number] Visit Date: 10/06/2019 Requested by: Libby Maw, [address] [place],  [place] [address] PCP: Libby Maw, MD  Subjective: Chief Complaint  Patient presents with  . Right Knee - Pain    Pain right knee since May this year. Saw a sports med doctor and got some better with exercises and rest. Started hurting worse again 1 week ago. Tightness posterior knee. "Feels bruised" medial/lateral knee.    HPI: He is here with right knee pain.  Onset about 6 months ago, no injury.  He went to Dr. Raeford Razor who did an ultrasound of his knee showing possible degenerative changes of his meniscus.  He rested from activities for about 6 or 8 weeks but his pain never went away completely.  He has been using ibuprofen and an over-the-counter knee brace.  The knee swells intermittently, feels tight when he bends it, he feels like he has a bruised sensation on the medial and lateral aspects of his knee.  No locking or giving way.  He is very frustrated by his ongoing pain and inability to exercise like he wants to.  He is status post arthroscopic debridement of the meniscus tear in his left knee many years ago.  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              ROS: No fevers or chills.  All other systems were reviewed and are negative.  Objective: Vital Signs: There were no vitals taken for this visit.  Physical Exam:  General:  Alert and oriented, in no acute distress. Pulm:  Breathing unlabored. Psy:  Normal mood, congruent affect. Skin: No rash or bruising. Right knee: 1+ patellofemoral crepitus, full extension and flexion of 135 degrees.  He is tender on the medial and lateral joint lines, pain but no palpable click with McMurray's.  Lachman's feels solid, no laxity with varus or valgus stress.  No palpable popliteal cyst.  No tenderness over the  quadriceps or patellar tendons.  Imaging: None today.  Assessment & Plan: 1.  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  He would like to discuss this with his wife. -We will try meloxicam as needed.     Procedures: No procedures performed  No notes on file     PMFS History: Patient Active Problem List   Diagnosis Date Noted  . Acute pain of right knee 04/16/2019  . Strain of left knee 04/14/2019  . Class 1 obesity due to excess calories without serious comorbidity with body mass index (BMI) of 34.0 to 34.9 in adult 10/14/2018  . Atypical mole 01/23/2018  . Primary insomnia 01/23/2018  . Encounter for evaluation regarding contraception options 01/23/2018   Past Medical History:  Diagnosis Date  . Chicken pox     History reviewed. No pertinent family history.  History reviewed. No pertinent surgical history. Social History   Occupational History  . Not on file  Tobacco Use  . Smoking status: Never Smoker  . Smokeless tobacco: Never Used  Substance and Sexual Activity  . Alcohol use: Yes    Comment: occassional beers on the weekends.  . Drug use: Never  .  Sexual activity: Not on file

## 2019-10-10 ENCOUNTER — Ambulatory Visit: Payer: BC Managed Care – PPO | Admitting: Family Medicine

## 2019-10-11 ENCOUNTER — Other Ambulatory Visit: Payer: Self-pay | Admitting: Family Medicine

## 2019-10-11 DIAGNOSIS — F5101 Primary insomnia: Secondary | ICD-10-CM

## 2019-10-13 ENCOUNTER — Other Ambulatory Visit: Payer: Self-pay

## 2019-10-13 ENCOUNTER — Ambulatory Visit: Payer: BC Managed Care – PPO | Admitting: Family Medicine

## 2019-10-13 ENCOUNTER — Encounter: Payer: Self-pay | Admitting: Family Medicine

## 2019-10-13 DIAGNOSIS — M25561 Pain in right knee: Secondary | ICD-10-CM

## 2019-10-13 DIAGNOSIS — G8929 Other chronic pain: Secondary | ICD-10-CM | POA: Diagnosis not present

## 2019-10-13 NOTE — Progress Notes (Signed)
Subjective: He is here for planned right knee injection.  He would like to try to postpone any surgical intervention until after January 1 if possible.  Objective: No effusion, tender on the medial and lateral joint lines.  Full range of motion.  Procedure: Right knee steroid injection: After sterile prep Betadine, injected 3 cc 1% lidocaine without epinephrine and 40 mg methylprednisolone from lateral midpatellar approach.

## 2019-11-24 ENCOUNTER — Other Ambulatory Visit: Payer: Self-pay | Admitting: Family Medicine

## 2019-11-24 DIAGNOSIS — G8929 Other chronic pain: Secondary | ICD-10-CM

## 2019-12-08 ENCOUNTER — Ambulatory Visit (INDEPENDENT_AMBULATORY_CARE_PROVIDER_SITE_OTHER): Payer: BC Managed Care – PPO

## 2019-12-08 ENCOUNTER — Other Ambulatory Visit: Payer: Self-pay

## 2019-12-08 ENCOUNTER — Ambulatory Visit: Payer: BC Managed Care – PPO | Admitting: Family Medicine

## 2019-12-08 ENCOUNTER — Encounter: Payer: Self-pay | Admitting: Family Medicine

## 2019-12-08 DIAGNOSIS — M25561 Pain in right knee: Secondary | ICD-10-CM | POA: Diagnosis not present

## 2019-12-08 DIAGNOSIS — G8929 Other chronic pain: Secondary | ICD-10-CM

## 2019-12-08 NOTE — Progress Notes (Signed)
2 view x-rays of right knee are notable for a moderate sized joint effusion, mild patellofemoral spurring, mild to moderate medial compartment joint space narrowing.  No sign of stress fracture or loose body.

## 2019-12-17 ENCOUNTER — Other Ambulatory Visit: Payer: Self-pay

## 2019-12-17 ENCOUNTER — Ambulatory Visit
Admission: RE | Admit: 2019-12-17 | Discharge: 2019-12-17 | Disposition: A | Discharge status: Home / Self Care | Payer: BC Managed Care – PPO | Source: Ambulatory Visit | Attending: Family Medicine | Admitting: Family Medicine

## 2019-12-17 DIAGNOSIS — M25561 Pain in right knee: Secondary | ICD-10-CM

## 2019-12-17 DIAGNOSIS — G8929 Other chronic pain: Secondary | ICD-10-CM

## 2019-12-18 ENCOUNTER — Telehealth: Payer: Self-pay | Admitting: Family Medicine

## 2019-12-18 NOTE — Telephone Encounter (Signed)
Left message on the voice mail to call back regarding MRI results.

## 2019-12-18 NOTE — Telephone Encounter (Signed)
MRI confirms a tear of the medial meniscus cartilage.  If still having a lot of pain, we can schedule consultation with one of our surgeons.  Unfortunately, due to covid they might not be able to operate until April or later.

## 2019-12-18 NOTE — Telephone Encounter (Signed)
I called and advised the patient of his results. He would like to go ahead and consult with a surgeon. Appointment scheduled with Dr. Erlinda Hong on 12/23/19 at 1:30.

## 2019-12-23 ENCOUNTER — Encounter (HOSPITAL_BASED_OUTPATIENT_CLINIC_OR_DEPARTMENT_OTHER): Payer: Self-pay | Admitting: Orthopaedic Surgery

## 2019-12-23 ENCOUNTER — Ambulatory Visit: Payer: BC Managed Care – PPO | Admitting: Orthopaedic Surgery

## 2019-12-23 ENCOUNTER — Encounter: Payer: Self-pay | Admitting: Orthopaedic Surgery

## 2019-12-23 ENCOUNTER — Other Ambulatory Visit: Payer: Self-pay

## 2019-12-23 DIAGNOSIS — S83241A Other tear of medial meniscus, current injury, right knee, initial encounter: Secondary | ICD-10-CM | POA: Diagnosis not present

## 2019-12-23 NOTE — Progress Notes (Signed)
Office Visit Note   Patient: Richard Reynolds           Date of Birth: 06/01/1971           MRN: [id number] Visit Date: 12/23/2019              Requested by: Libby Maw, MD [address] [place],  [place] [address] PCP: Libby Maw, MD   Assessment & Plan: Visit Diagnoses:  1. Acute medial meniscus tear of right knee, initial encounter     Plan: My impression is symptomatic acute right medial meniscal tear.  The MRI was reviewed in detail with the patient and based on our discussion rehab recovery and risks and benefits were discussed with the patient today.  I think it be reasonable for him to stay out of work at least 1 to 2 weeks and then play by it ear after that.  Questions encouraged and answered.  Follow-Up Instructions: Return for 1 week postop visit.   Orders:  No orders of the defined types were placed in this encounter.  No orders of the defined types were placed in this encounter.     Procedures: No procedures performed   Clinical Data: No additional findings.   Subjective: Chief Complaint  Patient presents with  . Right Knee - Pain    Richard Reynolds is a very pleasant 49 year old gentleman who is very active and comes in for evaluation of a right medial meniscal tear that was found on the MRI.  He has not been able to jog or be as active as he wants for the last 8 months or so due to the pain.  Originally he thinks the injury started when he fell off a small deck and stepped wrong.  He had a cortisone injection with Dr. Junius Roads which only helped minimally.  Pennsaid did not help.  He still has cracking popping and soreness.  The swelling has resolved.  Denies any numbness and tingling.   Review of Systems  Constitutional: Negative.   All other systems reviewed and are negative.    Objective: Vital Signs: There were no vitals taken for this visit.  Physical Exam Vitals and nursing note reviewed.  Constitutional:      Appearance:  He is well-developed.  Pulmonary:     Effort: Pulmonary effort is normal.  Abdominal:     Palpations: Abdomen is soft.  Skin:    General: Skin is warm.  Neurological:     Mental Status: He is alert and oriented to person, place, and time.  Psychiatric:        Behavior: Behavior normal.        Thought Content: Thought content normal.        Judgment: Judgment normal.     Ortho Exam  Right knee exam shows medial joint line tenderness and pain with McMurray testing.  Slight restriction of full flexion due to pain.  Collaterals and cruciates are stable.  No joint effusion. Specialty Comments:  No specialty comments available.  Imaging: No results found.   PMFS History: Patient Active Problem List   Diagnosis Date Noted  . Acute medial meniscus tear of right knee 12/23/2019  . Acute pain of right knee 04/16/2019  . Strain of left knee 04/14/2019  . Class 1 obesity due to excess calories without serious comorbidity with body mass index (BMI) of 34.0 to 34.9 in adult 10/14/2018  . Atypical mole 01/23/2018  . Primary insomnia 01/23/2018  . Encounter for evaluation regarding  contraception options 01/23/2018   Past Medical History:  Diagnosis Date  . Chicken pox     History reviewed. No pertinent family history.  History reviewed. No pertinent surgical history. Social History   Occupational History  . Not on file  Tobacco Use  . Smoking status: Never Smoker  . Smokeless tobacco: Never Used  Substance and Sexual Activity  . Alcohol use: Yes    Comment: occassional beers on the weekends.  . Drug use: Never  . Sexual activity: Not on file

## 2019-12-25 ENCOUNTER — Other Ambulatory Visit: Payer: Self-pay

## 2019-12-25 ENCOUNTER — Other Ambulatory Visit (HOSPITAL_COMMUNITY)
Admission: RE | Admit: 2019-12-25 | Discharge: 2019-12-25 | Disposition: A | Discharge status: Home / Self Care | Payer: BC Managed Care – PPO | Source: Ambulatory Visit | Attending: Orthopaedic Surgery | Admitting: Orthopaedic Surgery

## 2019-12-25 DIAGNOSIS — Z20822 Contact with and (suspected) exposure to covid-19: Secondary | ICD-10-CM | POA: Insufficient documentation

## 2019-12-25 DIAGNOSIS — Z01812 Encounter for preprocedural laboratory examination: Secondary | ICD-10-CM | POA: Insufficient documentation

## 2019-12-25 LAB — SARS CORONAVIRUS 2 (TAT 6-24 HRS): SARS Coronavirus 2: NEGATIVE

## 2019-12-28 NOTE — Anesthesia Preprocedure Evaluation (Addendum)
Anesthesia Evaluation  Patient identified by MRN, date of birth, ID band Patient awake    Reviewed: Allergy & Precautions, NPO status , Patient's Chart, lab work & pertinent test results  Airway Mallampati: II  TM Distance: >3 FB Neck ROM: Full    Dental no notable dental hx. (+) Teeth Intact, Dental Advisory Given   Pulmonary neg pulmonary ROS,    Pulmonary exam normal breath sounds clear to auscultation       Cardiovascular Exercise Tolerance: Good negative cardio ROS Normal cardiovascular exam Rhythm:Regular Rate:Normal     Neuro/Psych negative neurological ROS     GI/Hepatic negative GI ROS, Neg liver ROS,   Endo/Other  negative endocrine ROS  Renal/GU negative Renal ROS     Musculoskeletal negative musculoskeletal ROS (+)   Abdominal (+) + obese,   Peds  Hematology   Anesthesia Other Findings   Reproductive/Obstetrics                            Anesthesia Physical Anesthesia Plan  ASA: II  Anesthesia Plan: General   Post-op Pain Management:    Induction: Intravenous  PONV Risk Score and Plan: 2 and 3 and Treatment may vary due to age or medical condition, Ondansetron and Dexamethasone  Airway Management Planned: LMA  Additional Equipment: None  Intra-op Plan:   Post-operative Plan:   Informed Consent:     Dental advisory given  Plan Discussed with: CRNA  Anesthesia Plan Comments:        Anesthesia Quick Evaluation

## 2019-12-29 ENCOUNTER — Ambulatory Visit (HOSPITAL_BASED_OUTPATIENT_CLINIC_OR_DEPARTMENT_OTHER): Payer: BC Managed Care – PPO | Admitting: Anesthesiology

## 2019-12-29 ENCOUNTER — Ambulatory Visit (HOSPITAL_BASED_OUTPATIENT_CLINIC_OR_DEPARTMENT_OTHER)
Admission: RE | Admit: 2019-12-29 | Discharge: 2019-12-29 | Disposition: A | Discharge status: Home / Self Care | Payer: BC Managed Care – PPO | Source: Ambulatory Visit | Attending: Orthopaedic Surgery | Admitting: Orthopaedic Surgery

## 2019-12-29 ENCOUNTER — Encounter (HOSPITAL_BASED_OUTPATIENT_CLINIC_OR_DEPARTMENT_OTHER)
Admission: RE | Disposition: A | Discharge status: Home / Self Care | Payer: Self-pay | Source: Ambulatory Visit | Attending: Orthopaedic Surgery

## 2019-12-29 ENCOUNTER — Other Ambulatory Visit: Payer: Self-pay

## 2019-12-29 ENCOUNTER — Encounter (HOSPITAL_BASED_OUTPATIENT_CLINIC_OR_DEPARTMENT_OTHER): Payer: Self-pay | Admitting: Orthopaedic Surgery

## 2019-12-29 DIAGNOSIS — S83241A Other tear of medial meniscus, current injury, right knee, initial encounter: Secondary | ICD-10-CM | POA: Insufficient documentation

## 2019-12-29 DIAGNOSIS — M2241 Chondromalacia patellae, right knee: Secondary | ICD-10-CM | POA: Insufficient documentation

## 2019-12-29 DIAGNOSIS — X58XXXA Exposure to other specified factors, initial encounter: Secondary | ICD-10-CM | POA: Diagnosis not present

## 2019-12-29 DIAGNOSIS — M6588 Other synovitis and tenosynovitis, other site: Secondary | ICD-10-CM | POA: Insufficient documentation

## 2019-12-29 HISTORY — PX: KNEE ARTHROSCOPY WITH MEDIAL MENISECTOMY: SHX5651

## 2019-12-29 SURGERY — ARTHROSCOPY, KNEE, WITH MEDIAL MENISCECTOMY
Anesthesia: General | Site: Knee | Laterality: Right

## 2019-12-29 MED ORDER — HYDROMORPHONE HCL 1 MG/ML IJ SOLN: 0.2500 mg | INTRAMUSCULAR | Status: DC | PRN

## 2019-12-29 MED ORDER — PROPOFOL 10 MG/ML IV BOLUS
INTRAVENOUS | Status: AC
  Filled 2019-12-29: qty 20

## 2019-12-29 MED ORDER — HYDROCODONE-ACETAMINOPHEN 7.5-325 MG PO TABS: 1.0000 | ORAL_TABLET | Freq: Once | ORAL | Status: DC | PRN

## 2019-12-29 MED ORDER — MIDAZOLAM HCL 2 MG/2ML IJ SOLN
INTRAMUSCULAR | Status: AC
  Filled 2019-12-29: qty 2

## 2019-12-29 MED ORDER — SODIUM CHLORIDE 0.9 % IR SOLN
Status: DC | PRN
  Administered 2019-12-29: 3000 mL

## 2019-12-29 MED ORDER — CEFAZOLIN SODIUM-DEXTROSE 2-4 GM/100ML-% IV SOLN
2.0000 g | INTRAVENOUS | Status: AC
  Administered 2019-12-29: 2 g via INTRAVENOUS

## 2019-12-29 MED ORDER — MIDAZOLAM HCL 5 MG/5ML IJ SOLN
INTRAMUSCULAR | Status: DC | PRN
  Administered 2019-12-29: 2 mg via INTRAVENOUS

## 2019-12-29 MED ORDER — BUPIVACAINE HCL (PF) 0.25 % IJ SOLN
INTRAMUSCULAR | Status: DC | PRN
  Administered 2019-12-29: 20 mL

## 2019-12-29 MED ORDER — LACTATED RINGERS IV SOLN: INTRAVENOUS | Status: DC

## 2019-12-29 MED ORDER — LIDOCAINE 2% (20 MG/ML) 5 ML SYRINGE
INTRAMUSCULAR | Status: AC
  Filled 2019-12-29: qty 5

## 2019-12-29 MED ORDER — FENTANYL CITRATE (PF) 100 MCG/2ML IJ SOLN
INTRAMUSCULAR | Status: AC
  Filled 2019-12-29: qty 2

## 2019-12-29 MED ORDER — DEXAMETHASONE SODIUM PHOSPHATE 4 MG/ML IJ SOLN
INTRAMUSCULAR | Status: DC | PRN
  Administered 2019-12-29: 5 mg via INTRAVENOUS

## 2019-12-29 MED ORDER — CHLORHEXIDINE GLUCONATE 4 % EX LIQD: 60.0000 mL | Freq: Once | CUTANEOUS | Status: DC

## 2019-12-29 MED ORDER — FENTANYL CITRATE (PF) 100 MCG/2ML IJ SOLN: 50.0000 ug | INTRAMUSCULAR | Status: DC | PRN

## 2019-12-29 MED ORDER — ONDANSETRON HCL 4 MG PO TABS: 4.0000 mg | ORAL_TABLET | Freq: Three times a day (TID) | ORAL | 0 refills | Status: DC | PRN

## 2019-12-29 MED ORDER — ACETAMINOPHEN 10 MG/ML IV SOLN
INTRAVENOUS | Status: AC
  Filled 2019-12-29: qty 100

## 2019-12-29 MED ORDER — ONDANSETRON HCL 4 MG/2ML IJ SOLN: 4.0000 mg | Freq: Once | INTRAMUSCULAR | Status: DC | PRN

## 2019-12-29 MED ORDER — DEXAMETHASONE SODIUM PHOSPHATE 10 MG/ML IJ SOLN
INTRAMUSCULAR | Status: AC
  Filled 2019-12-29: qty 1

## 2019-12-29 MED ORDER — FENTANYL CITRATE (PF) 250 MCG/5ML IJ SOLN
INTRAMUSCULAR | Status: DC | PRN
  Administered 2019-12-29 (×3): 50 ug via INTRAVENOUS

## 2019-12-29 MED ORDER — ACETAMINOPHEN 10 MG/ML IV SOLN: 1000.0000 mg | Freq: Once | INTRAVENOUS | Status: DC | PRN

## 2019-12-29 MED ORDER — LIDOCAINE 2% (20 MG/ML) 5 ML SYRINGE
INTRAMUSCULAR | Status: DC | PRN
  Administered 2019-12-29: 60 mg via INTRAVENOUS

## 2019-12-29 MED ORDER — CEFAZOLIN SODIUM-DEXTROSE 2-4 GM/100ML-% IV SOLN
INTRAVENOUS | Status: AC
  Filled 2019-12-29: qty 100

## 2019-12-29 MED ORDER — MIDAZOLAM HCL 2 MG/2ML IJ SOLN: 1.0000 mg | INTRAMUSCULAR | Status: DC | PRN

## 2019-12-29 MED ORDER — ONDANSETRON HCL 4 MG/2ML IJ SOLN
INTRAMUSCULAR | Status: AC
  Filled 2019-12-29: qty 2

## 2019-12-29 MED ORDER — HYDROCODONE-ACETAMINOPHEN 5-325 MG PO TABS: 1.0000 | ORAL_TABLET | Freq: Three times a day (TID) | ORAL | 0 refills | Status: DC | PRN

## 2019-12-29 MED ORDER — MEPERIDINE HCL 25 MG/ML IJ SOLN: 6.2500 mg | INTRAMUSCULAR | Status: DC | PRN

## 2019-12-29 SURGICAL SUPPLY — 32 items
BANDAGE ESMARK 6X9 LF (GAUZE/BANDAGES/DRESSINGS) IMPLANT
BLADE SHAVER TORPEDO 4X13 (MISCELLANEOUS) IMPLANT
BNDG ELASTIC 6X5.8 VLCR STR LF (GAUZE/BANDAGES/DRESSINGS) ×6 IMPLANT
BNDG ESMARK 6X9 LF (GAUZE/BANDAGES/DRESSINGS)
COVER WAND RF STERILE (DRAPES) IMPLANT
CUFF TOURN SGL QUICK 34 (TOURNIQUET CUFF) ×2
CUFF TRNQT CYL 34X4.125X (TOURNIQUET CUFF) ×1 IMPLANT
DRAPE ARTHROSCOPY W/POUCH 90 (DRAPES) ×3 IMPLANT
DRAPE IMP U-DRAPE 54X76 (DRAPES) ×3 IMPLANT
DRAPE U-SHAPE 47X51 STRL (DRAPES) ×3 IMPLANT
DURAPREP 26ML APPLICATOR (WOUND CARE) ×3 IMPLANT
GAUZE SPONGE 4X4 12PLY STRL (GAUZE/BANDAGES/DRESSINGS) ×3 IMPLANT
GAUZE XEROFORM 1X8 LF (GAUZE/BANDAGES/DRESSINGS) ×3 IMPLANT
GLOVE BIOGEL PI IND STRL 7.0 (GLOVE) ×1 IMPLANT
GLOVE BIOGEL PI INDICATOR 7.0 (GLOVE) ×2
GLOVE ECLIPSE 7.0 STRL STRAW (GLOVE) ×3 IMPLANT
GLOVE SKINSENSE NS SZ7.5 (GLOVE) ×2
GLOVE SKINSENSE STRL SZ7.5 (GLOVE) ×1 IMPLANT
GLOVE SURG SYN 7.5  E (GLOVE) ×2
GLOVE SURG SYN 7.5 E (GLOVE) ×1 IMPLANT
GOWN STRL REIN XL XLG (GOWN DISPOSABLE) ×3 IMPLANT
GOWN STRL REUS W/ TWL LRG LVL3 (GOWN DISPOSABLE) ×1 IMPLANT
GOWN STRL REUS W/ TWL XL LVL3 (GOWN DISPOSABLE) ×1 IMPLANT
GOWN STRL REUS W/TWL LRG LVL3 (GOWN DISPOSABLE) ×2
GOWN STRL REUS W/TWL XL LVL3 (GOWN DISPOSABLE) ×2
KNEE WRAP E Z 3 GEL PACK (MISCELLANEOUS) ×3 IMPLANT
MANIFOLD NEPTUNE II (INSTRUMENTS) ×3 IMPLANT
PACK ARTHROSCOPY DSU (CUSTOM PROCEDURE TRAY) ×3 IMPLANT
PACK BASIN DAY SURGERY FS (CUSTOM PROCEDURE TRAY) ×3 IMPLANT
SUT ETHILON 3 0 PS 1 (SUTURE) ×3 IMPLANT
TOWEL GREEN STERILE FF (TOWEL DISPOSABLE) ×3 IMPLANT
TUBING ARTHROSCOPY IRRIG 16FT (MISCELLANEOUS) ×3 IMPLANT

## 2019-12-29 NOTE — Op Note (Signed)
   Surgery Date: 12/29/2019  Surgeon(s): Leandrew Koyanagi, MD  ASSIST: Madalyn Rob, [place]; necessary for the timely completion of procedure and due to complexity of procedure.  ANESTHESIA:  general  FLUIDS: Per anesthesia record.   ESTIMATED BLOOD LOSS: minimal  PREOPERATIVE DIAGNOSES:  1. Right knee medial meniscus tear 2. Right knee synovitis  POSTOPERATIVE DIAGNOSES:  same  PROCEDURES PERFORMED:  1. Right knee arthroscopy with major synovectomy 2. Right knee arthroscopy with arthroscopic partial medial meniscectomy 3. Right knee arthroscopy with arthroscopic chondroplasty medial femoral condyle and patella  DESCRIPTION OF PROCEDURE: Mr. Vernier is a 49 y.o.-year-old male with right knee medial meniscus tear. Plans are to proceed with partial medial meniscectomy and diagnostic arthroscopy with debridement as indicated. Full discussion held regarding risks benefits alternatives and complications related surgical intervention. Conservative care options reviewed. All questions answered.  The patient was identified in the preoperative holding area and the operative extremity was marked. The patient was brought to the operating room and transferred to operating table in a supine position. Satisfactory general anesthesia was induced by anesthesiology.    Standard anterolateral, anteromedial arthroscopy portals were obtained. The anteromedial portal was obtained with a spinal needle for localization under direct visualization with subsequent diagnostic findings.   Incisions were made for knee arthroscopy portals.  Diagnostic knee anoscopy was first performed.  Then performed a major synovectomy in all 3 compartments using oscillating shaver.  We then reposition the arthroscope in the medial compartment with a gentle valgus stress to the knee we were able to open up the medial compartment to find the displaced tear of the posterior horn the medial meniscus.  Partial medial meniscectomy  was then performed back to a stable rim using meniscus basket and oscillating shaver.  Gentle chondroplasty was performed for the medial femoral condyle.  We then reposition the arthroscope in the lateral compartment which was unremarkable.  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  Gutters were checked for loose bodies.  Excess fluid was removed from the knee joint.  Incisions were closed with interrupted nylon sutures.  Sterile dressings were applied.  Patient tolerated procedure well had no immediate complications.  Suprapatellar pouch and gutters: moderate synovitis or debris. Patella chondral surface: Grade 2 Trochlear chondral surface: Grade 1 Patellofemoral tracking: normal Medial meniscus: large radial tear.  Medial femoral condyle weight bearing surface: Grade 2-3 Medial tibial plateau: Grade 1-2 Anterior cruciate ligament:stable Posterior cruciate ligament:stable Lateral meniscus: normal.   Lateral femoral condyle weight bearing surface: Grade 0 Lateral tibial plateau: Grade 0  DISPOSITION: The patient was awakened from general anesthetic, extubated, taken to the recovery room in medically stable condition, no apparent complications. The patient may be weightbearing as tolerated to the operative lower extremity.  Range of motion of right knee as tolerated.  Azucena Cecil, MD [facility] 12:01 PM

## 2019-12-29 NOTE — Discharge Instructions (Signed)
° ° °Post-operative patient instructions  °Knee Arthroscopy  ° °• Ice:  Place intermittent ice or cooler pack over your knee, 30 minutes on and 30 minutes off.  Continue this for the first 72 hours after surgery, then save ice for use after therapy sessions or on more active days.   °• Weight:  You may bear weight on your leg as your symptoms allow. °• Crutches:  Use crutches (or walker) to assist in walking until told to discontinue by your physical therapist or physician. This will help to reduce pain. °• Strengthening:  Perform simple thigh squeezes (isometric quad contractions) and straight leg lifts as you are able (3 sets of 5 to 10 repetitions, 3 times a day).  For the leg lifts, have someone support under your ankle in the beginning until you have increased strength enough to do this on your own.  To help get started on thigh squeezes, place a pillow under your knee and push down on the pillow with back of knee (sometimes easier to do than with your leg fully straight). °• Motion:  Perform gentle knee motion as tolerated - this is gentle bending and straightening of the knee. Seated heel slides: you can start by sitting in a chair, remove your brace, and gently slide your heel back on the floor - allowing your knee to bend. Have someone help you straighten your knee (or use your other leg/foot hooked under your ankle.  °• Dressing:  Perform 1st dressing change at 2 days postoperative. A moderate amount of blood tinged drainage is to be expected.  So if you bleed through the dressing on the first or second day or if you have fevers, it is fine to change the dressing/check the wounds early and redress wound. Elevate your leg.  If it bleeds through again, or if the incisions are leaking frank blood, please call the office. May change dressing every 1-2 days thereafter to help watch wounds. Can purchase Tegaderm (or 3M Nexcare) water resistant dressings at local pharmacy / [facility]. °• Shower:  Light shower is  ok after 2 days.  Please take shower, NO bath. Recover with gauze and ace wrap to help keep wounds protected.   °• Pain medication:  A narcotic pain medication has been prescribed.  Take as directed.  Typically you need narcotic pain medication more regularly during the first 3 to 5 days after surgery.  Decrease your use of the medication as the pain improves.  Narcotics can sometimes cause constipation, even after a few doses.  If you have problems with constipation, you can take an over the counter stool softener or light laxative.  If you have persistent problems, please notify your physician’s office. °• Physical therapy: Additional activity guidelines to be provided by your physician or physical therapist at follow-up visits.  °• Driving: Do not recommend driving x 2 weeks post surgical, especially if surgery performed on right side. Should not drive while taking narcotic pain medications. It typically takes at least 2 weeks to restore sufficient neuromuscular function for normal reaction times for driving safety.  °• Call [phone] for questions or problems. Evenings you will be forwarded to the hospital operator.  Ask for the orthopaedic physician on call. Please call if you experience:  °  °o Redness, foul smelling, or persistent drainage from the surgical site  °o worsening knee pain and swelling not responsive to medication  °o any calf pain and or swelling of the lower leg  °o temperatures greater than   101.5 F o other questions or concerns   Thank you for allowing us to be a part of your care.  

## 2019-12-29 NOTE — Anesthesia Postprocedure Evaluation (Signed)
Anesthesia Post Note  Patient: Richard Reynolds  Procedure(s) Performed: RIGHT KNEE ARTHROSCOPY WITH PARTIAL MEDIAL MENISCECTOMY (Right Knee)     Patient location during evaluation: PACU Anesthesia Type: General Level of consciousness: awake and alert Pain management: pain level controlled Vital Signs Assessment: post-procedure vital signs reviewed and stable Respiratory status: spontaneous breathing, nonlabored ventilation, respiratory function stable and patient connected to nasal cannula oxygen Cardiovascular status: blood pressure returned to baseline and stable Postop Assessment: no apparent nausea or vomiting Anesthetic complications: no    Last Vitals:  Vitals:   12/29/19 1235 12/29/19 1237  BP: (!) 142/101 (!) 144/85  Pulse: (!) 59 66  Resp: 14 13  Temp:    SpO2: 100% 100%    Last Pain:  Vitals:   12/29/19 1237  TempSrc:   PainSc: 5                  Barnet Glasgow

## 2019-12-29 NOTE — H&P (Signed)
    PREOPERATIVE H&P  Chief Complaint: right knee medial meniscal tear  HPI: Richard Reynolds is a 49 y.o. male who presents for surgical treatment of right knee medial meniscal tear.  He denies any changes in medical history.  Past Medical History:  Diagnosis Date  . Chicken pox   . 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 KNEE ARTHROSCOPY Left    x2  . PYLOROMYOTOMY  11/22/1973   Social History   Socioeconomic History  . Marital status: Married    Spouse name: Not on file  . Number of children: Not on file  . Years of education: Not on file  . Highest education level: Not on file  Occupational History  . Not on file  Tobacco Use  . Smoking status: Never Smoker  . Smokeless tobacco: Never Used  Substance and Sexual Activity  . Alcohol use: Yes    Comment: occassional beers on the weekends.  . Drug use: Never  . Sexual activity: Not on file  Other Topics Concern  . Not on file  Social History Narrative  . Not on file   Social Determinants of Health   Financial Resource Strain:   . Difficulty of Paying Living Expenses: Not on file  Food Insecurity:   . Worried About Charity fundraiser in the Last Year: Not on file  . Ran Out of Food in the Last Year: Not on file  Transportation Needs:   . Lack of Transportation (Medical): Not on file  . Lack of Transportation (Non-Medical): Not on file  Physical Activity:   . Days of Exercise per Week: Not on file  . Minutes of Exercise per Session: Not on file  Stress:   . Feeling of Stress : Not on file  Social Connections:   . Frequency of Communication with Friends and Family: Not on file  . Frequency of Social Gatherings with Friends and Family: Not on file  . Attends Religious Services: Not on file  . Active Member of Clubs or Organizations: Not on file  . Attends Archivist Meetings: Not on file  . Marital Status: Not on  file   History reviewed. No pertinent family history. No Known Allergies Prior to Admission medications   Medication Sig Start Date End Date Taking? Authorizing Provider  traZODone (DESYREL) 100 MG tablet TAKE 1 TABLET(100 MG) BY MOUTH AT BEDTIME AS NEEDED FOR SLEEP 10/13/19  Yes Libby Maw, MD     Positive ROS: All other systems have been reviewed and were otherwise negative with the exception of those mentioned in the HPI and as above.  Physical Exam: General: Alert, no acute distress Cardiovascular: No pedal edema Respiratory: No cyanosis, no use of accessory musculature GI: abdomen soft Skin: No lesions in the area of chief complaint Neurologic: Sensation intact distally Psychiatric: Patient is competent for consent with normal mood and affect Lymphatic: no lymphedema  MUSCULOSKELETAL: exam stable  Assessment: right knee medial meniscal tear  Plan: Plan for Procedure(s): RIGHT KNEE ARTHROSCOPY WITH PARTIAL MEDIAL MENISCECTOMY  The risks benefits and alternatives were discussed with the patient including but not limited to the risks of nonoperative treatment, versus surgical intervention including infection, bleeding, nerve injury,  blood clots, cardiopulmonary complications, morbidity, mortality, among others, and they were willing to proceed.   Eduard Roux, MD   12/29/2019 10:30 AM

## 2019-12-29 NOTE — Anesthesia Procedure Notes (Signed)
Procedure Name: LMA Insertion Date/Time: 12/29/2019 11:25 AM Performed by: Verita Lamb, CRNA Pre-anesthesia Checklist: Patient identified, Emergency Drugs available, Suction available and Patient being monitored Patient Re-evaluated:Patient Re-evaluated prior to induction Oxygen Delivery Method: Circle system utilized Preoxygenation: Pre-oxygenation with 100% oxygen Induction Type: IV induction Ventilation: Mask ventilation without difficulty LMA: LMA inserted LMA Size: 4.0 Number of attempts: 1 Airway Equipment and Method: Bite block Placement Confirmation: positive ETCO2 Tube secured with: Tape Dental Injury: Teeth and Oropharynx as per pre-operative assessment

## 2019-12-29 NOTE — Transfer of Care (Signed)
Immediate Anesthesia Transfer of Care Note  Patient: Richard Reynolds  Procedure(s) Performed: RIGHT KNEE ARTHROSCOPY WITH PARTIAL MEDIAL MENISCECTOMY (Right Knee)  Patient Location: PACU  Anesthesia Type:General  Level of Consciousness: awake, alert  and oriented  Airway & Oxygen Therapy: Patient Spontanous Breathing and Patient connected to face mask oxygen  Post-op Assessment: Report given to RN and Post -op Vital signs reviewed and stable  Post vital signs: Reviewed and stable  Last Vitals:  Vitals Value Taken Time  BP 132/83 12/29/19 1208  Temp 36.6 C 12/29/19 1208  Pulse 53 12/29/19 1211  Resp 16 12/29/19 1212  SpO2 100 % 12/29/19 1211  Vitals shown include unvalidated device data.  Last Pain:  Vitals:   12/29/19 0915  TempSrc: Oral  PainSc: 0-No pain      Patients Stated Pain Goal: 3 ([id number])  Complications: No apparent anesthesia complications

## 2019-12-30 ENCOUNTER — Encounter: Payer: Self-pay | Admitting: *Deleted

## 2019-12-31 ENCOUNTER — Other Ambulatory Visit: Payer: Self-pay

## 2019-12-31 ENCOUNTER — Telehealth: Payer: Self-pay

## 2019-12-31 NOTE — Telephone Encounter (Signed)
Called patient to advise on message.  

## 2019-12-31 NOTE — Telephone Encounter (Signed)
Either was is ok

## 2019-12-31 NOTE — Telephone Encounter (Signed)
Patient called wanting to know if he is suppose to re-wrap his right knee with ace after he removes it in 72 hours?  Patient had surgery on Monday, 12/29/2019.  Cb# is [phone].  Please advise.  Thank you.

## 2020-01-06 ENCOUNTER — Encounter: Payer: Self-pay | Admitting: Orthopaedic Surgery

## 2020-01-06 ENCOUNTER — Other Ambulatory Visit: Payer: Self-pay

## 2020-01-06 ENCOUNTER — Ambulatory Visit (INDEPENDENT_AMBULATORY_CARE_PROVIDER_SITE_OTHER): Payer: BC Managed Care – PPO | Admitting: Orthopaedic Surgery

## 2020-01-06 DIAGNOSIS — S83241A Other tear of medial meniscus, current injury, right knee, initial encounter: Secondary | ICD-10-CM

## 2020-01-06 NOTE — Progress Notes (Signed)
   Post-Op Visit Note   Patient: Richard Reynolds           Date of Birth: 12-27-1970           MRN: [id number] Visit Date: 01/06/2020 PCP: Libby Maw, MD   Assessment & Plan:  Chief Complaint:  Chief Complaint  Patient presents with  . Right Knee - Routine Post Op, Follow-up   Visit Diagnoses:  1. Acute medial meniscus tear of right knee, initial encounter     Plan: Richard Reynolds is 1 week status post right knee scope partial medial meniscectomy.  He is doing well overall.  He is having some trouble with range of motion.  Surgical incisions are healed.  He has a moderate joint effusion.  No signs of infection.  Mild swelling of the ankle foot.  Arthroscopy pictures were reviewed with the patient in detail today.  He may slowly increase activity as tolerated.  Continue to use ice regularly.  Home exercises were provided.  I think it is fine for him to work from home for the next 3 weeks.  Recheck in 3 weeks.  If his range of motion does not progress significantly we may need to consider sending him to outpatient physical therapy.  Follow-Up Instructions: Return in about 3 weeks (around 01/27/2020).   Orders:  No orders of the defined types were placed in this encounter.  No orders of the defined types were placed in this encounter.   Imaging: No results found.  PMFS History: Patient Active Problem List   Diagnosis Date Noted  . Acute medial meniscus tear of right knee 12/23/2019  . Acute pain of right knee 04/16/2019  . Strain of left knee 04/14/2019  . Class 1 obesity due to excess calories without serious comorbidity with body mass index (BMI) of 34.0 to 34.9 in adult 10/14/2018  . Atypical mole 01/23/2018  . Primary insomnia 01/23/2018  . Encounter for evaluation regarding contraception options 01/23/2018   Past Medical History:  Diagnosis Date  . Chicken pox   . 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 No  pertinent family history.  Past Surgical History:  Procedure Laterality Date  . KNEE ARTHROSCOPY Left    x2  . KNEE ARTHROSCOPY WITH MEDIAL MENISECTOMY Right 12/29/2019   Procedure: RIGHT KNEE ARTHROSCOPY WITH PARTIAL MEDIAL MENISCECTOMY;  Surgeon: Leandrew Koyanagi, MD;  Location: [facility];  Service: Orthopedics;  Laterality: Right;  . PYLOROMYOTOMY  11/22/1973   Social History   Occupational History  . Not on file  Tobacco Use  . Smoking status: Never Smoker  . Smokeless tobacco: Never Used  Substance and Sexual Activity  . Alcohol use: Yes    Comment: occassional beers on the weekends.  . Drug use: Never  . Sexual activity: Not on file

## 2020-01-28 ENCOUNTER — Ambulatory Visit (INDEPENDENT_AMBULATORY_CARE_PROVIDER_SITE_OTHER): Payer: BC Managed Care – PPO | Admitting: Orthopaedic Surgery

## 2020-01-28 ENCOUNTER — Encounter: Payer: Self-pay | Admitting: Orthopaedic Surgery

## 2020-01-28 ENCOUNTER — Other Ambulatory Visit: Payer: Self-pay

## 2020-01-28 DIAGNOSIS — Z9889 Other specified postprocedural states: Secondary | ICD-10-CM

## 2020-01-28 NOTE — Progress Notes (Signed)
   Post-Op Visit Note   Patient: Richard Reynolds           Date of Birth: 15-Jun-1971           MRN: [id number] Visit Date: 01/28/2020 PCP: Libby Maw, MD   Assessment & Plan:  Chief Complaint:  Chief Complaint  Patient presents with  . Right Knee - Pain, Follow-up   Visit Diagnoses:  1. S/P right knee arthroscopy     Plan: Patient is a pleasant 49 year old gentleman who comes in today 4 weeks out right knee arthroscopic debridement medial meniscus and chondroplasty.  Was noted during operative intervention he had grade 2 changes to the patella and grade 2 and 3 changes to the medial femoral condyle.  He has been doing well over the past few weeks.  He is ambulating unassisted.  He is working with a Clinical research associate where he is making great progress with range of motion.  Examination of his right knee reveals well healed surgical portals without complication.  Calf soft nontender.  Range of motion 0 to 115 degrees.  He is neurovascular intact distally.  At this point, he will advance with activity as tolerated.  Continue with home exercise program.  Follow-up with [place] in 4 weeks time for final check.  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  This will start today.  Call with concerns or questions in meantime.  Follow-Up Instructions: Return in about 4 weeks (around 02/25/2020).   Orders:  No orders of the defined types were placed in this encounter.  No orders of the defined types were placed in this encounter.   Imaging: No new imaging  PMFS History: Patient Active Problem List   Diagnosis Date Noted  . Acute medial meniscus tear of right knee 12/23/2019  . Acute pain of right knee 04/16/2019  . Strain of left knee 04/14/2019  . Class 1 obesity due to excess calories without serious comorbidity with body mass index (BMI) of 34.0 to 34.9 in adult 10/14/2018  . Atypical mole 01/23/2018  . Primary insomnia 01/23/2018  . Encounter  for evaluation regarding contraception options 01/23/2018   Past Medical History:  Diagnosis Date  . Chicken pox   . 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 No pertinent family history.  Past Surgical History:  Procedure Laterality Date  . KNEE ARTHROSCOPY Left    x2  . KNEE ARTHROSCOPY WITH MEDIAL MENISECTOMY Right 12/29/2019   Procedure: RIGHT KNEE ARTHROSCOPY WITH PARTIAL MEDIAL MENISCECTOMY;  Surgeon: Leandrew Koyanagi, MD;  Location: [facility];  Service: Orthopedics;  Laterality: Right;  . PYLOROMYOTOMY  11/22/1973   Social History   Occupational History  . Not on file  Tobacco Use  . Smoking status: Never Smoker  . Smokeless tobacco: Never Used  Substance and Sexual Activity  . Alcohol use: Yes    Comment: occassional beers on the weekends.  . Drug use: Never  . Sexual activity: Not on file

## 2020-02-25 ENCOUNTER — Other Ambulatory Visit: Payer: Self-pay

## 2020-02-25 ENCOUNTER — Ambulatory Visit (INDEPENDENT_AMBULATORY_CARE_PROVIDER_SITE_OTHER): Payer: BC Managed Care – PPO | Admitting: Orthopaedic Surgery

## 2020-02-25 DIAGNOSIS — Z9889 Other specified postprocedural states: Secondary | ICD-10-CM

## 2020-02-25 MED ORDER — MELOXICAM 7.5 MG PO TABS: 7.5000 mg | ORAL_TABLET | Freq: Two times a day (BID) | ORAL | 2 refills | Status: DC | PRN

## 2020-02-25 NOTE — Progress Notes (Signed)
   Post-Op Visit Note   Patient: Richard Reynolds           Date of Birth: 1971/10/19           MRN: [id number] Visit Date: 02/25/2020 PCP: Libby Maw, MD   Assessment & Plan:  Chief Complaint:  Chief Complaint  Patient presents with  . Right Knee - Pain   Visit Diagnoses:  1. S/P right knee arthroscopy     Plan: Tjay is 8 weeks status post right knee arthroscopy partial medial meniscectomy.  He reports soreness at the end of the day and he states that his pain is mainly anterior across the patella tendon and wrapping around laterally.  He denies any joint effusion.  He is doing stretches.  Right knee shows fully healed surgical scars without joint effusion.  He has some mild edema around the lateral side of the knee.  No joint line tenderness.  No patellofemoral crepitus.  Good range of motion.  Collaterals and cruciates are stable.  Impression is a week status post right knee arthroscopy.  I feel that his symptoms are related to residual weakness in his quadriceps.  I do not get the sense that he is having the recurrent meniscal pain that he was having.  I have recommended low impact activities as well as ice and NSAIDs as needed.  He will work on Forensic scientist.  We will see him back in another month for recheck.  Follow-Up Instructions: Return in about 4 weeks (around 03/24/2020).   Orders:  No orders of the defined types were placed in this encounter.  Meds ordered this encounter  Medications  . meloxicam (MOBIC) 7.5 MG tablet    Sig: Take 1 tablet (7.5 mg total) by mouth 2 (two) times daily as needed for pain.    Dispense:  30 tablet    Refill:  2    Imaging: No results found.  PMFS History: Patient Active Problem List   Diagnosis Date Noted  . Acute medial meniscus tear of right knee 12/23/2019  . Acute pain of right knee 04/16/2019  . Strain of left knee 04/14/2019  . Class 1 obesity due to excess calories without serious comorbidity with body  mass index (BMI) of 34.0 to 34.9 in adult 10/14/2018  . Atypical mole 01/23/2018  . Primary insomnia 01/23/2018  . Encounter for evaluation regarding contraception options 01/23/2018   Past Medical History:  Diagnosis Date  . Chicken pox   . 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  Past Surgical History:  Procedure Laterality Date  . KNEE ARTHROSCOPY Left    x2  . KNEE ARTHROSCOPY WITH MEDIAL MENISECTOMY Right 12/29/2019   Procedure: RIGHT KNEE ARTHROSCOPY WITH PARTIAL MEDIAL MENISCECTOMY;  Surgeon: Leandrew Koyanagi, MD;  Location: [facility];  Service: Orthopedics;  Laterality: Right;  . PYLOROMYOTOMY  11/22/1973   Social History   Occupational History  . Not on file  Tobacco Use  . Smoking status: Never Smoker  . Smokeless tobacco: Never Used  Substance and Sexual Activity  . Alcohol use: Yes    Comment: occassional beers on the weekends.  . Drug use: Never  . Sexual activity: Not on file

## 2020-03-24 ENCOUNTER — Other Ambulatory Visit: Payer: Self-pay

## 2020-03-24 ENCOUNTER — Encounter: Payer: Self-pay | Admitting: Orthopaedic Surgery

## 2020-03-24 ENCOUNTER — Ambulatory Visit (INDEPENDENT_AMBULATORY_CARE_PROVIDER_SITE_OTHER): Payer: BC Managed Care – PPO | Admitting: Physician Assistant

## 2020-03-24 DIAGNOSIS — Z9889 Other specified postprocedural states: Secondary | ICD-10-CM

## 2020-03-24 NOTE — Progress Notes (Signed)
   Post-Op Visit Note   Patient: Richard Reynolds           Date of Birth: [birth year]           MRN: [id number] Visit Date: 03/24/2020 PCP: Libby Maw, MD   Assessment & Plan:  Chief Complaint:  Chief Complaint  Patient presents with  . Right Knee - Pain, Follow-up   Visit Diagnoses:  1. S/P right knee arthroscopy     Plan: Patient is a pleasant 49 year old gentleman who presents our clinic 12 weeks status post right knee arthroscopic debridement medial meniscus 12/29/2019.  He has been doing okay.  He initially had continued pain and swelling which did seem to subside over the past few weeks with strengthening his quadriceps.  Overall, he is doing much better at this point.  He does take meloxicam as needed.  He does not notice that he has continued to limp.  Examination of his right knee shows no effusion.  Range of motion 0 to 125 degrees.  5 out of 5 strength with resisted straight leg raise.  He is neurovascular intact distally.  At this point, he will continue to advance activity as tolerated.  He will follow-up with [place] as needed.  Follow-Up Instructions: Return if symptoms worsen or fail to improve.   Orders:  No orders of the defined types were placed in this encounter.  No orders of the defined types were placed in this encounter.   Imaging: No new imaging  PMFS History: Patient Active Problem List   Diagnosis Date Noted  . Acute medial meniscus tear of right knee 12/23/2019  . Acute pain of right knee 04/16/2019  . Strain of left knee 04/14/2019  . Class 1 obesity due to excess calories without serious comorbidity with body mass index (BMI) of 34.0 to 34.9 in adult 10/14/2018  . Atypical mole 01/23/2018  . Primary insomnia 01/23/2018  . Encounter for evaluation regarding contraception options 01/23/2018   Past Medical History:  Diagnosis Date  . Chicken pox   . 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  No pertinent family history.  Past Surgical History:  Procedure Laterality Date  . KNEE ARTHROSCOPY Left    x2  . KNEE ARTHROSCOPY WITH MEDIAL MENISECTOMY Right 12/29/2019   Procedure: RIGHT KNEE ARTHROSCOPY WITH PARTIAL MEDIAL MENISCECTOMY;  Surgeon: Leandrew Koyanagi, MD;  Location: [place] [facility];  Service: Orthopedics;  Laterality: Right;  . PYLOROMYOTOMY  11/22/1973   Social History   Occupational History  . Not on file  Tobacco Use  . Smoking status: Never Smoker  . Smokeless tobacco: Never Used  Substance and Sexual Activity  . Alcohol use: Yes    Comment: occassional beers on the weekends.  . Drug use: Never  . Sexual activity: Not on file

## 2020-04-15 ENCOUNTER — Other Ambulatory Visit: Payer: Self-pay | Admitting: Family Medicine

## 2020-04-15 DIAGNOSIS — F5101 Primary insomnia: Secondary | ICD-10-CM

## 2020-04-19 ENCOUNTER — Other Ambulatory Visit: Payer: Self-pay | Admitting: Orthopaedic Surgery

## 2020-07-15 ENCOUNTER — Other Ambulatory Visit: Payer: Self-pay | Admitting: Family Medicine

## 2020-07-15 DIAGNOSIS — F5101 Primary insomnia: Secondary | ICD-10-CM

## 2020-07-21 ENCOUNTER — Other Ambulatory Visit: Payer: Self-pay | Admitting: Family Medicine

## 2020-07-21 DIAGNOSIS — F5101 Primary insomnia: Secondary | ICD-10-CM

## 2020-08-24 NOTE — Telephone Encounter (Signed)
Appointment schedueld

## 2020-08-24 NOTE — Telephone Encounter (Signed)
Called patient to schedule appointment for follow up, no answer LMTCB and schedule follow up appt.

## 2020-09-15 ENCOUNTER — Other Ambulatory Visit: Payer: Self-pay

## 2020-09-16 ENCOUNTER — Encounter: Payer: Self-pay | Admitting: Family Medicine

## 2020-09-16 ENCOUNTER — Ambulatory Visit: Payer: BC Managed Care – PPO | Admitting: Family Medicine

## 2020-09-16 DIAGNOSIS — F4320 Adjustment disorder, unspecified: Secondary | ICD-10-CM | POA: Insufficient documentation

## 2020-09-16 DIAGNOSIS — F5101 Primary insomnia: Secondary | ICD-10-CM | POA: Diagnosis not present

## 2020-09-16 DIAGNOSIS — Z0000 Encounter for general adult medical examination without abnormal findings: Secondary | ICD-10-CM

## 2020-09-16 MED ORDER — TRAZODONE HCL 100 MG PO TABS: ORAL_TABLET | ORAL | 3 refills | Status: DC

## 2020-09-16 NOTE — Progress Notes (Signed)
Established Patient Office Visit  Subjective:  Patient ID: Richard Reynolds, male    DOB: Aug 22, 1971  Age: 49 y.o. MRN: [id number]  CC:  Chief Complaint  Patient presents with  . Follow-up    follow up on medications,     HPI Richard Reynolds presents for follow-up of insomnia and adjustment disorder.  Trazodone continues to work well for him.  He is in a good place place now.  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  Past Medical History:  Diagnosis Date  . Chicken pox   . 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 KNEE ARTHROSCOPY Left    x2  . KNEE ARTHROSCOPY WITH MEDIAL MENISECTOMY Right 12/29/2019   Procedure: RIGHT KNEE ARTHROSCOPY WITH PARTIAL MEDIAL MENISCECTOMY;  Surgeon: Richard Koyanagi, MD;  Location: [facility];  Service: Orthopedics;  Laterality: Right;  . PYLOROMYOTOMY  11/22/1973    No family history on file.  Social History   Socioeconomic History  . Marital status: Married    Spouse name: Not on file  . Number of children: Not on file  . Years of education: Not on file  . Highest education level: Not on file  Occupational History  . Not on file  Tobacco Use  . Smoking status: Never Smoker  . Smokeless tobacco: Never Used  Vaping Use  . Vaping Use: Never used  Substance and Sexual Activity  . Alcohol use: Yes    Comment: occassional beers on the weekends.  . Drug use: Never  . Sexual activity: Not on file  Other Topics Concern  . Not on file  Social History Narrative  . Not on file   Social Determinants of Health   Financial Resource Strain:   . Difficulty of Paying Living Expenses: Not on file  Food Insecurity:   . Worried About Charity fundraiser in the Last Year: Not on file  . Ran Out of Food in the Last Year: Not on file  Transportation Needs:   . Lack of  Transportation (Medical): Not on file  . Lack of Transportation (Non-Medical): Not on file  Physical Activity:   . Days of Exercise per Week: Not on file  . Minutes of Exercise per Session: Not on file  Stress:   . Feeling of Stress : Not on file  Social Connections:   . Frequency of Communication with Friends and Family: Not on file  . Frequency of Social Gatherings with Friends and Family: Not on file  . Attends Religious Services: Not on file  . Active Member of Clubs or Organizations: Not on file  . Attends Archivist Meetings: Not on file  . Marital Status: Not on file  Intimate Partner Violence:   . Fear of Current or Ex-Partner: Not on file  . Emotionally Abused: Not on file  . Physically Abused: Not on file  . Sexually Abused: Not on file    Outpatient Medications Prior to Visit  Medication Sig Dispense Refill  . traZODone (DESYREL) 100 MG tablet TAKE 1 TABLET(100 MG) BY MOUTH AT BEDTIME AS NEEDED FOR SLEEP 90 tablet 0  . HYDROcodone-acetaminophen (NORCO) 5-325 MG tablet Take 1-2 tablets by mouth 3 (three) times daily as needed. (Patient not taking: Reported on 09/16/2020) 20 tablet 0  . meloxicam (MOBIC) 7.5 MG tablet TAKE  1 TABLET(7.5 MG) BY MOUTH TWICE DAILY AS NEEDED FOR PAIN (Patient not taking: Reported on 09/16/2020) 30 tablet 2  . ondansetron (ZOFRAN) 4 MG tablet Take 1-2 tablets (4-8 mg total) by mouth every 8 (eight) hours as needed for nausea or vomiting. (Patient not taking: Reported on 09/16/2020) 20 tablet 0   No facility-administered medications prior to visit.    No Known Allergies  ROS Review of Systems  Constitutional: Negative.   HENT: Negative.   Respiratory: Negative.   Cardiovascular: Negative.   Gastrointestinal: Negative.   Endocrine: Negative for polyphagia and polyuria.  Genitourinary: Negative.   Musculoskeletal: Negative for gait problem and joint swelling.  Skin: Negative for color change and pallor.  Allergic/Immunologic:  Negative for immunocompromised state.  Neurological: Positive for headaches. Negative for light-headedness.  Hematological: Does not bruise/bleed easily.  Psychiatric/Behavioral: Negative.  Negative for confusion, decreased concentration and dysphoric mood.      Objective:    Physical Exam Vitals and nursing note reviewed.  Constitutional:      Appearance: Normal appearance. He is normal weight.  HENT:     Head: Normocephalic and atraumatic.     Right Ear: Tympanic membrane, ear canal and external ear normal.     Left Ear: Tympanic membrane, ear canal and external ear normal.     Mouth/Throat:     Mouth: Mucous membranes are moist.     Pharynx: Oropharynx is clear. No oropharyngeal exudate or posterior oropharyngeal erythema.  Eyes:     General:        Right eye: No discharge.        Left eye: No discharge.     Conjunctiva/sclera: Conjunctivae normal.     Pupils: Pupils are equal, round, and reactive to light.  Cardiovascular:     Rate and Rhythm: Normal rate and regular rhythm.  Pulmonary:     Effort: Pulmonary effort is normal.     Breath sounds: Normal breath sounds.  Abdominal:     General: Bowel sounds are normal.  Musculoskeletal:     Cervical back: No rigidity or tenderness.     Right lower leg: No edema.     Left lower leg: No edema.  Lymphadenopathy:     Cervical: No cervical adenopathy.  Skin:    General: Skin is warm and dry.  Neurological:     Mental Status: He is alert and oriented to person, place, and time.  Psychiatric:        Mood and Affect: Mood normal.        Behavior: Behavior normal.     There were no vitals taken for this visit. Wt Readings from Last 3 Encounters:  12/29/19 227 lb 1.2 oz (103 kg)  04/14/19 212 lb (96.2 kg)  10/14/18 220 lb (99.8 kg)     Health Maintenance Due  Topic Date Due  . Hepatitis C Screening  Never done  . TETANUS/TDAP  Never done  . INFLUENZA VACCINE  07/04/2020    There are no preventive care reminders  to display for this patient.  Lab Results  Component Value Date   TSH 1.03 01/24/2018   Lab Results  Component Value Date   WBC 4.7 01/24/2018   HGB 15.4 01/24/2018   HCT 45.9 01/24/2018   MCV 85.9 01/24/2018   PLT 207.0 01/24/2018   Lab Results  Component Value Date   NA 141 01/24/2018   K 4.1 01/24/2018   CO2 31 01/24/2018   GLUCOSE 110 (H) 01/24/2018   BUN 15  01/24/2018   CREATININE 0.93 01/24/2018   BILITOT 0.7 01/24/2018   ALKPHOS 51 01/24/2018   AST 23 01/24/2018   ALT 18 01/24/2018   PROT 6.7 01/24/2018   ALBUMIN 4.3 01/24/2018   CALCIUM 9.5 01/24/2018   GFR 93.67 01/24/2018   Lab Results  Component Value Date   CHOL 171 01/24/2018   Lab Results  Component Value Date   HDL 56.10 01/24/2018   Lab Results  Component Value Date   LDLCALC 103 (H) 01/24/2018   Lab Results  Component Value Date   TRIG 59.0 01/24/2018   Lab Results  Component Value Date   CHOLHDL 3 01/24/2018   No results found for: HGBA1C    Assessment & Plan:   Problem List Items Addressed This Visit      Other   Primary insomnia - Primary   Relevant Medications   traZODone (DESYREL) 100 MG tablet   Healthcare maintenance   Relevant Orders   CBC   Comprehensive metabolic panel   Lipid panel   Urinalysis, Routine w reflex microscopic      Meds ordered this encounter  Medications  . traZODone (DESYREL) 100 MG tablet    Sig: TAKE 1 TABLET(100 MG) BY MOUTH AT BEDTIME AS NEEDED FOR SLEEP    Dispense:  90 tablet    Refill:  3    Follow-up: Return in about 1 year (around 09/16/2021), or Return fasting for above ordered blood work.Libby Maw, MD

## 2020-09-27 ENCOUNTER — Other Ambulatory Visit: Payer: Self-pay

## 2020-09-28 ENCOUNTER — Other Ambulatory Visit (INDEPENDENT_AMBULATORY_CARE_PROVIDER_SITE_OTHER): Payer: BC Managed Care – PPO

## 2020-09-28 DIAGNOSIS — Z0000 Encounter for general adult medical examination without abnormal findings: Secondary | ICD-10-CM | POA: Diagnosis not present

## 2020-09-28 LAB — CBC
HCT: 45 % (ref 39.0–52.0)
Hemoglobin: 15.2 g/dL (ref 13.0–17.0)
MCHC: 33.8 g/dL (ref 30.0–36.0)
MCV: 86.7 fl (ref 78.0–100.0)
Platelets: 192 10*3/uL (ref 150.0–400.0)
RBC: 5.19 Mil/uL (ref 4.22–5.81)
RDW: 13.3 % (ref 11.5–15.5)
WBC: 5.8 10*3/uL (ref 4.0–10.5)

## 2020-09-28 LAB — LIPID PANEL
Cholesterol: 189 mg/dL (ref 0–200)
HDL: 67 mg/dL (ref >39.00)
LDL Cholesterol: 101 mg/dL — ABNORMAL HIGH (ref 0–99)
NonHDL: 122.08
Total CHOL/HDL Ratio: 3
Triglycerides: 106 mg/dL (ref 0.0–149.0)
VLDL: 21.2 mg/dL (ref 0.0–40.0)

## 2020-09-28 LAB — COMPREHENSIVE METABOLIC PANEL
ALT: 19 U/L (ref 0–53)
AST: 23 U/L (ref 0–37)
Albumin: 4.4 g/dL (ref 3.5–5.2)
Alkaline Phosphatase: 56 U/L (ref 39–117)
BUN: 15 mg/dL (ref 6–23)
CO2: 29 mEq/L (ref 19–32)
Calcium: 9.1 mg/dL (ref 8.4–10.5)
Chloride: 104 mEq/L (ref 96–112)
Creatinine, Ser: 0.99 mg/dL (ref 0.40–1.50)
GFR: 91.03 mL/min (ref >60.00)
Glucose, Bld: 107 mg/dL — ABNORMAL HIGH (ref 70–99)
Potassium: 4.2 mEq/L (ref 3.5–5.1)
Sodium: 140 mEq/L (ref 135–145)
Total Bilirubin: 0.6 mg/dL (ref 0.2–1.2)
Total Protein: 6.5 g/dL (ref 6.0–8.3)

## 2021-01-20 ENCOUNTER — Other Ambulatory Visit: Payer: Self-pay | Admitting: Family Medicine

## 2021-01-20 DIAGNOSIS — F5101 Primary insomnia: Secondary | ICD-10-CM

## 2021-11-15 IMAGING — MR MR KNEE*R* W/O CM
6 series · 40 of 40 positions shown · non-contrast
Comparison: Right knee x-rays dated December 08, 2019.

CLINICAL DATA: Chronic medial knee pain since injury 9 months ago.

EXAM:
MRI OF THE RIGHT KNEE WITHOUT CONTRAST
TECHNIQUE: Multiplanar, multisequence MR imaging of the knee was performed. No
intravenous contrast was administered.

[Series 6: T2 fat-sat · axial · right · 4.0mm · 0.50mm/px · z∈[-59,+76]mm · 7 of 28 slices shown (1 of 3)]
[im 1/28]
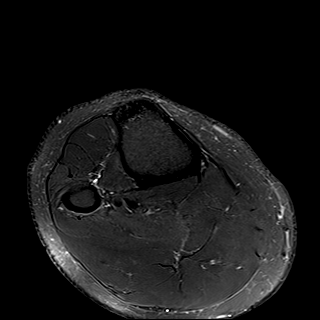
[im 5/28]
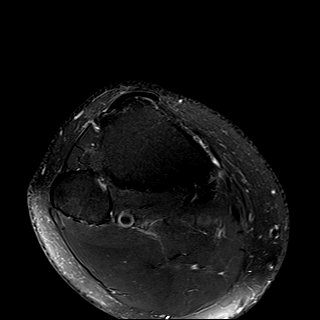
[im 10/28]
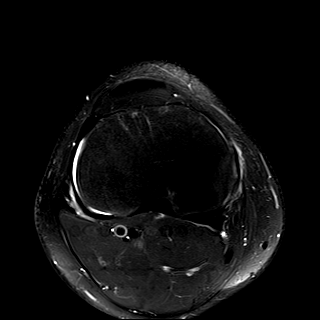
[im 14/28]
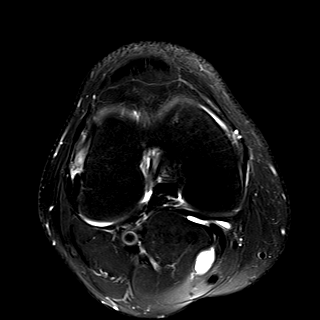
[im 19/28]
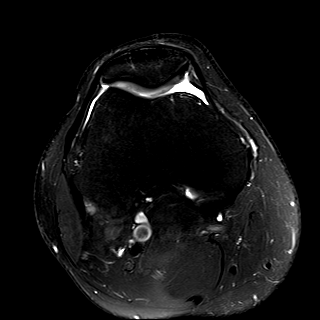
[im 23/28]
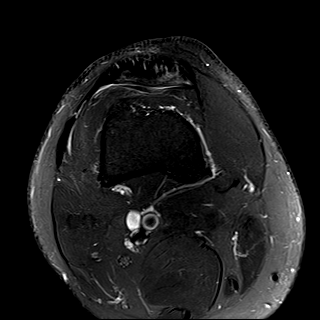
[im 28/28]
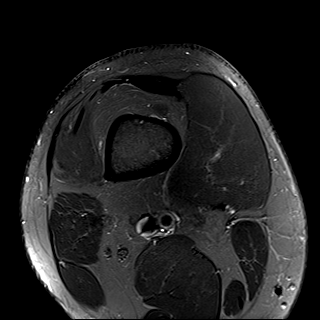

[Series 7: T2 fat-sat · coronal · right · 4.0mm · 0.47mm/px · 7 of 28 slices shown (2 of 3)]
[im 1/28]
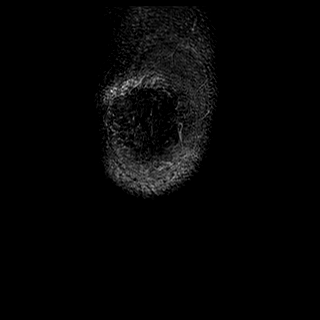
[im 5/28]
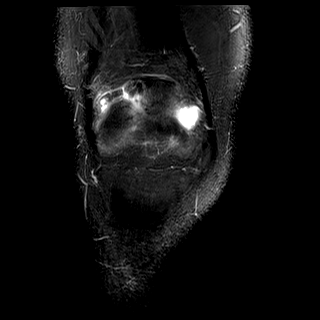
[im 10/28]
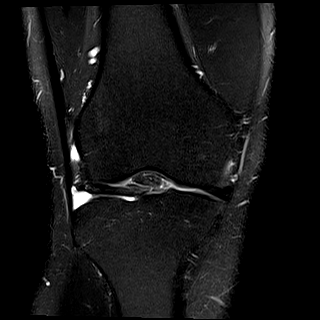
[im 14/28]
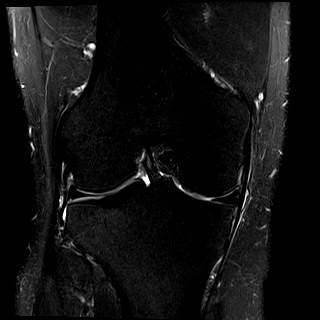
[im 19/28]
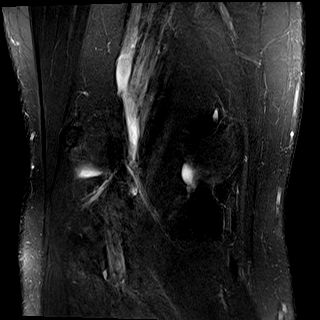
[im 23/28]
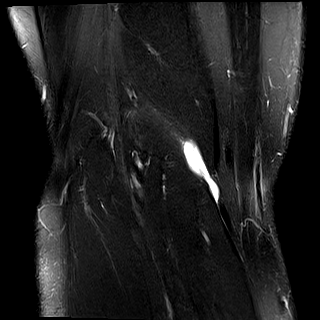
[im 28/28]
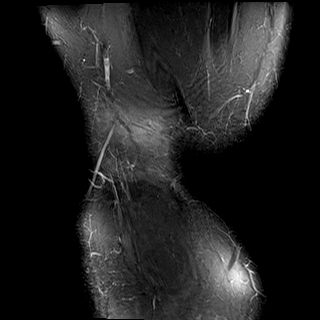

[Series 8: T1 · coronal · right · 4.0mm · 0.47mm/px · 7 of 28 slices shown]
[im 1/28]
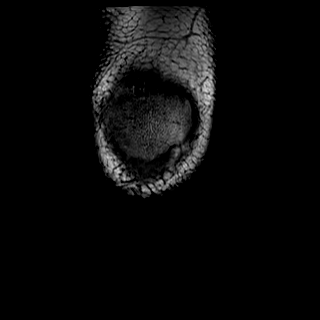
[im 5/28]
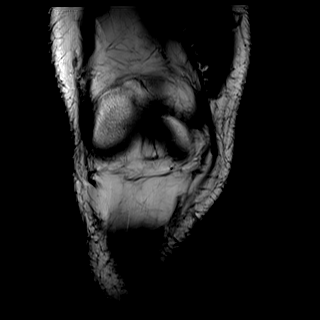
[im 10/28]
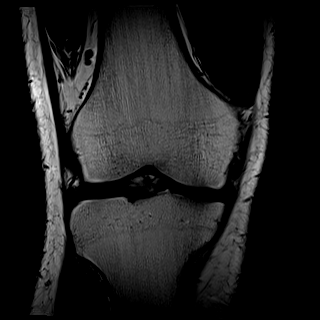
[im 14/28]
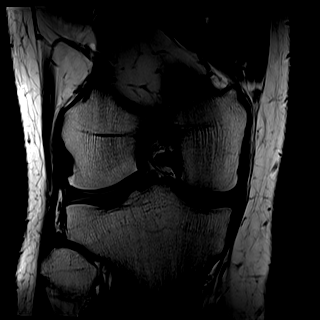
[im 19/28]
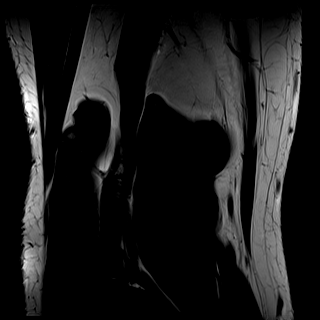
[im 23/28]
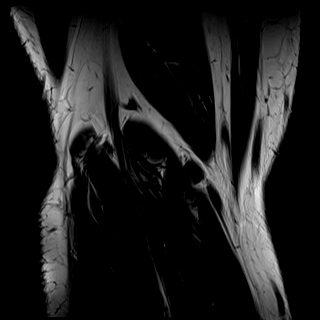
[im 28/28]
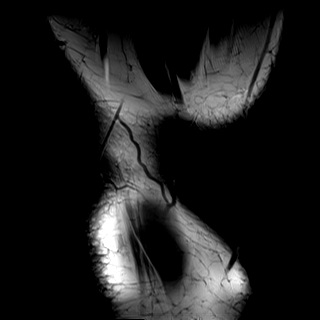

[Series 9: PD fat-sat · coronal · right · 3.3mm · 0.47mm/px · 7 of 31 slices shown (1 of 2)]
[im 1/31]
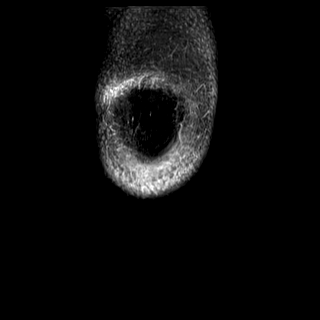
[im 6/31]
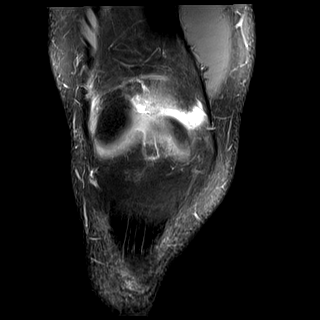
[im 11/31]
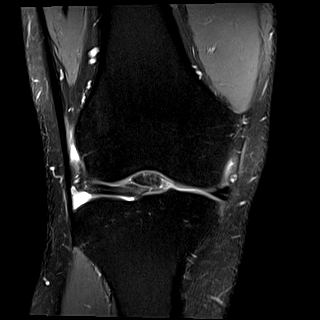
[im 16/31]
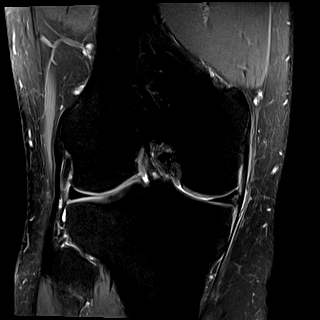
[im 21/31]
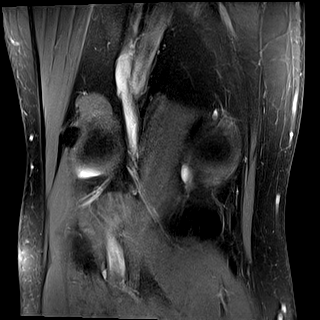
[im 26/31]
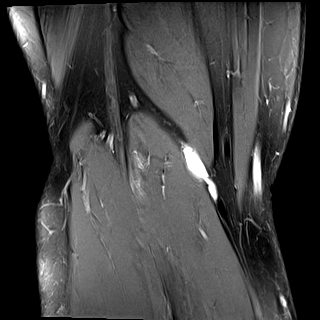
[im 31/31]
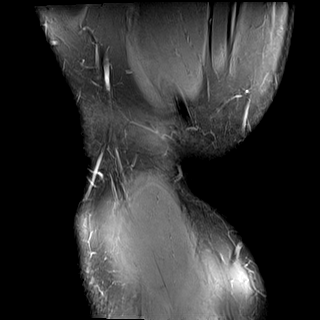

[Series 10: PD fat-sat · sagittal · right · 3.3mm · 0.47mm/px · 6 of 27 slices shown (2 of 2)]
[im 1/27]
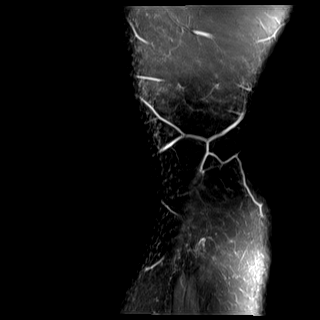
[im 6/27]
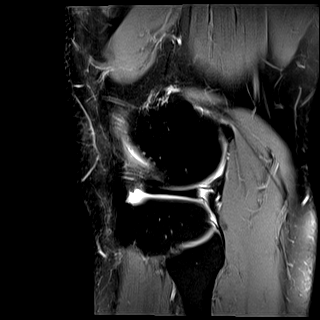
[im 11/27]
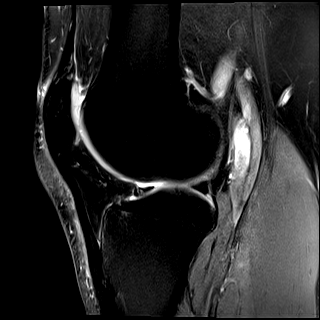
[im 16/27]
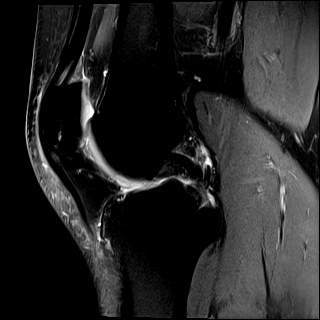
[im 21/27]
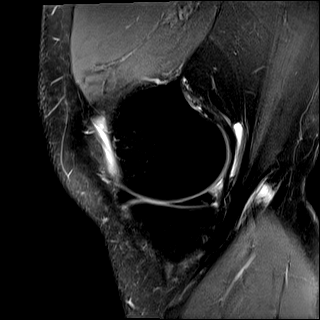
[im 27/27]
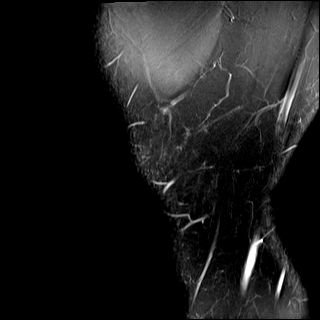

[Series 11: T2 fat-sat · sagittal · right · 3.3mm · 0.47mm/px · 6 of 27 slices shown (3 of 3)]
[im 1/27]
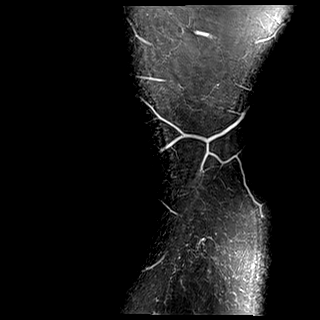
[im 6/27]
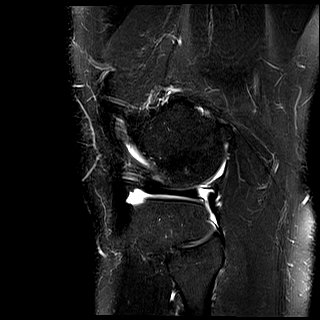
[im 11/27]
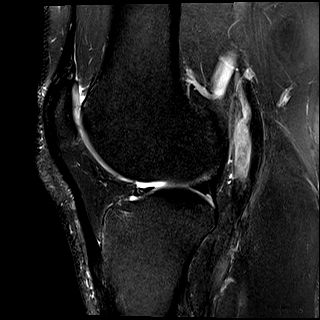
[im 16/27]
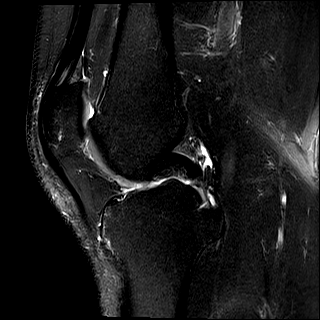
[im 21/27]
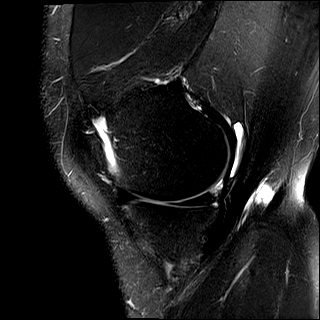
[im 27/27]
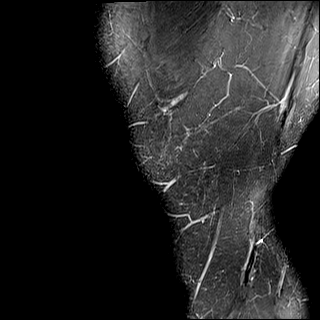

[40 of 40 positions shown; findings below may reference images not displayed]

FINDINGS: MENISCI

Medial meniscus: Oblique undersurface tear of the posterior horn and
fraying of the free edge.

Lateral meniscus:  Intact.

LIGAMENTS

Cruciates:  Intact ACL and PCL.

Collaterals: Medial collateral ligament is intact. Lateral
collateral ligament complex is intact.

CARTILAGE

Patellofemoral: Superficial cartilage irregularity over the medial
and lateral patellar facets.

Medial: Partial-thickness cartilage loss over central weight-bearing
medial femoral condyle.

Lateral:  No chondral defect.

Joint:  No joint effusion. Normal Hoffa's fat. No plical thickening.

Popliteal Fossa:  Small Baker cyst. Intact popliteus tendon.

Extensor Mechanism: Intact quadriceps tendon and patellar tendon.
Intact medial and lateral patellar retinaculum. Intact MPFL.

Bones: No focal marrow signal abnormality. No fracture or
dislocation.

Other: None.
IMPRESSION: 1. Oblique undersurface tear of the medial meniscus posterior horn
and fraying of the free edge.
2. Mild medial and early patellofemoral compartment osteoarthritis.

## 2022-01-05 ENCOUNTER — Other Ambulatory Visit: Payer: Self-pay | Admitting: Family

## 2022-01-05 DIAGNOSIS — F5101 Primary insomnia: Secondary | ICD-10-CM

## 2022-04-05 ENCOUNTER — Other Ambulatory Visit: Payer: Self-pay | Admitting: Family Medicine

## 2022-04-05 DIAGNOSIS — F5101 Primary insomnia: Secondary | ICD-10-CM

## 2022-07-09 ENCOUNTER — Other Ambulatory Visit: Payer: Self-pay | Admitting: Family Medicine

## 2022-07-09 DIAGNOSIS — F5101 Primary insomnia: Secondary | ICD-10-CM

## 2022-07-19 ENCOUNTER — Ambulatory Visit (INDEPENDENT_AMBULATORY_CARE_PROVIDER_SITE_OTHER): Payer: Managed Care, Other (non HMO) | Admitting: Family Medicine

## 2022-07-19 ENCOUNTER — Encounter: Payer: Self-pay | Admitting: Family Medicine

## 2022-07-19 VITALS — BP 132/78 | HR 54 | Temp 97.5°F | Ht 69.0 in | Wt 227.6 lb

## 2022-07-19 DIAGNOSIS — Z0000 Encounter for general adult medical examination without abnormal findings: Secondary | ICD-10-CM | POA: Diagnosis not present

## 2022-07-19 DIAGNOSIS — F5101 Primary insomnia: Secondary | ICD-10-CM | POA: Diagnosis not present

## 2022-07-19 LAB — LIPID PANEL
Cholesterol: 196 mg/dL (ref 0–200)
HDL: 72.2 mg/dL (ref >39.00)
LDL Cholesterol: 111 mg/dL — ABNORMAL HIGH (ref 0–99)
NonHDL: 123.32
Total CHOL/HDL Ratio: 3
Triglycerides: 62 mg/dL (ref 0.0–149.0)
VLDL: 12.4 mg/dL (ref 0.0–40.0)

## 2022-07-19 LAB — URINALYSIS
Bilirubin Urine: NEGATIVE
Hgb urine dipstick: NEGATIVE
Ketones, ur: NEGATIVE
Leukocytes,Ua: NEGATIVE
Nitrite: NEGATIVE
Specific Gravity, Urine: 1.025 (ref 1.000–1.030)
Total Protein, Urine: NEGATIVE
Urine Glucose: NEGATIVE
Urobilinogen, UA: 0.2 (ref 0.0–1.0)
pH: 6 (ref 5.0–8.0)

## 2022-07-19 LAB — COMPREHENSIVE METABOLIC PANEL
ALT: 26 U/L (ref 0–53)
AST: 28 U/L (ref 0–37)
Albumin: 4.5 g/dL (ref 3.5–5.2)
Alkaline Phosphatase: 59 U/L (ref 39–117)
BUN: 16 mg/dL (ref 6–23)
CO2: 31 mEq/L (ref 19–32)
Calcium: 9.5 mg/dL (ref 8.4–10.5)
Chloride: 101 mEq/L (ref 96–112)
Creatinine, Ser: 0.99 mg/dL (ref 0.40–1.50)
GFR: 89.88 mL/min (ref >60.00)
Glucose, Bld: 117 mg/dL — ABNORMAL HIGH (ref 70–99)
Potassium: 4 mEq/L (ref 3.5–5.1)
Sodium: 138 mEq/L (ref 135–145)
Total Bilirubin: 0.6 mg/dL (ref 0.2–1.2)
Total Protein: 7.7 g/dL (ref 6.0–8.3)

## 2022-07-19 LAB — CBC WITH DIFFERENTIAL/PLATELET
Basophils Absolute: 0 10*3/uL (ref 0.0–0.1)
Basophils Relative: 0.6 % (ref 0.0–3.0)
Eosinophils Absolute: 0.2 10*3/uL (ref 0.0–0.7)
Eosinophils Relative: 2.3 % (ref 0.0–5.0)
HCT: 47.1 % (ref 39.0–52.0)
Hemoglobin: 15.6 g/dL (ref 13.0–17.0)
Lymphocytes Relative: 20.1 % (ref 12.0–46.0)
Lymphs Abs: 1.6 10*3/uL (ref 0.7–4.0)
MCHC: 33.1 g/dL (ref 30.0–36.0)
MCV: 88.5 fl (ref 78.0–100.0)
Monocytes Absolute: 0.6 10*3/uL (ref 0.1–1.0)
Monocytes Relative: 7.7 % (ref 3.0–12.0)
Neutro Abs: 5.5 10*3/uL (ref 1.4–7.7)
Neutrophils Relative %: 69.3 % (ref 43.0–77.0)
Platelets: 235 10*3/uL (ref 150.0–400.0)
RBC: 5.32 Mil/uL (ref 4.22–5.81)
RDW: 13.6 % (ref 11.5–15.5)
WBC: 8 10*3/uL (ref 4.0–10.5)

## 2022-07-19 LAB — HEMOGLOBIN A1C: Hgb A1c MFr Bld: 6.3 % (ref 4.6–6.5)

## 2022-07-19 MED ORDER — TRAZODONE HCL 100 MG PO TABS: ORAL_TABLET | ORAL | 3 refills | Status: DC

## 2022-07-19 NOTE — Progress Notes (Signed)
Established Patient Office Visit  Subjective   Patient ID: Richard Reynolds, male    DOB: 05-10-1971  Age: 51 y.o. MRN: [id number]  Chief Complaint  Patient presents with   Annual Exam    CPE, no concerns. Patient fasting.     HPI for physical exam and follow-up of insomnia.  Trazodone continues to work well for him.  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  Continues to remain quite active with swimming and running.  Has never had a colonoscopy.  He has regular dental care.    Review of Systems  Constitutional: Negative.   HENT: Negative.    Eyes:  Negative for blurred vision, discharge and redness.  Respiratory: Negative.    Cardiovascular: Negative.   Gastrointestinal:  Negative for abdominal pain.  Genitourinary: Negative.   Musculoskeletal:  Positive for joint pain. Negative for myalgias.  Skin:  Negative for rash.  Neurological:  Negative for tingling, loss of consciousness and weakness.  Endo/Heme/Allergies:  Negative for polydipsia.      07/19/2022    9:01 AM 07/19/2022    8:26 AM 09/16/2020    4:05 PM  Depression screen PHQ 2/9  Decreased Interest 0 0 0  Down, Depressed, Hopeless 0 0 0  PHQ - 2 Score 0 0 0  Altered sleeping 0  0  Tired, decreased energy 0  0  Change in appetite 0  0  Feeling bad or failure about yourself  0  0  Trouble concentrating 1  0  Moving slowly or fidgety/restless 0  0  Suicidal thoughts 0  0  PHQ-9 Score 1  0  Difficult doing work/chores Not difficult at all  Not difficult at all       Objective:     BP 132/78 (BP Location: Left Arm, Patient Position: Sitting, Cuff Size: Large)   Pulse (!) 54   Temp (!) 97.5 F (36.4 C) (Temporal)   Ht '5\' 9"'$  (1.753 m)   Wt 227 lb 9.6 oz (103.2 kg)   SpO2 97%   BMI 33.61 kg/m    Physical Exam Constitutional:      General: He is not in acute distress.    Appearance: Normal  appearance. He is not ill-appearing, toxic-appearing or diaphoretic.  HENT:     Head: Normocephalic and atraumatic.     Right Ear: External ear normal.     Left Ear: External ear normal.     Mouth/Throat:     Mouth: Mucous membranes are moist.     Pharynx: Oropharynx is clear. No oropharyngeal exudate or posterior oropharyngeal erythema.  Eyes:     General: No scleral icterus.       Right eye: No discharge.        Left eye: No discharge.     Extraocular Movements: Extraocular movements intact.     Conjunctiva/sclera: Conjunctivae normal.     Pupils: Pupils are equal, round, and reactive to light.  Cardiovascular:     Rate and Rhythm: Normal rate and regular rhythm.  Pulmonary:     Effort: Pulmonary effort is normal. No respiratory distress.     Breath sounds: Normal breath sounds.  Abdominal:     General: Bowel sounds are normal.     Tenderness: There is no abdominal tenderness. There is no guarding.  Musculoskeletal:     Cervical back: No rigidity or tenderness.  Legs:  Skin:    General: Skin is warm and dry.  Neurological:     Mental Status: He is alert and oriented to person, place, and time.  Psychiatric:        Mood and Affect: Mood normal.        Behavior: Behavior normal.      No results found for any visits on 07/19/22.    The 10-year ASCVD risk score (Arnett DK, et al., 2019) is: 2%    Assessment & Plan:   Problem List Items Addressed This Visit       Other   Primary insomnia   Relevant Medications   traZODone (DESYREL) 100 MG tablet   Healthcare maintenance - Primary   Relevant Orders   CBC with Differential/Platelet   Comprehensive metabolic panel   Hemoglobin A1c   Lipid panel   Urinalysis   Ambulatory referral to Gastroenterology    Return in about 1 year (around 07/20/2023), or if symptoms worsen or fail to improve.  Encouraged patient to continue his healthy and active lifestyle.  Information was given on calorie counting to lose  some weight.  Information was given on insomnia and colonoscopy.  Agrees to go for consultation but is not certain as to when he can go through with the procedure.  Libby Maw, MD

## 2022-10-11 ENCOUNTER — Encounter: Payer: Self-pay | Admitting: Family Medicine

## 2022-12-26 ENCOUNTER — Encounter: Payer: Self-pay | Admitting: Family Medicine

## 2022-12-26 ENCOUNTER — Ambulatory Visit: Payer: BC Managed Care – PPO | Admitting: Family Medicine

## 2022-12-26 VITALS — BP 144/84 | HR 64 | Temp 97.2°F | Ht 69.0 in | Wt 239.6 lb

## 2022-12-26 DIAGNOSIS — L0291 Cutaneous abscess, unspecified: Secondary | ICD-10-CM | POA: Diagnosis not present

## 2022-12-26 DIAGNOSIS — Z1211 Encounter for screening for malignant neoplasm of colon: Secondary | ICD-10-CM

## 2022-12-26 DIAGNOSIS — R030 Elevated blood-pressure reading, without diagnosis of hypertension: Secondary | ICD-10-CM

## 2022-12-26 MED ORDER — DOXYCYCLINE HYCLATE 100 MG PO TABS: 100.0000 mg | ORAL_TABLET | Freq: Two times a day (BID) | ORAL | 0 refills | Status: AC

## 2022-12-26 NOTE — Progress Notes (Signed)
Established Patient Office Visit   Subjective:  Patient ID: Richard Reynolds, male    DOB: 1971/05/29  Age: 52 y.o. MRN: [id number]  Chief Complaint  Patient presents with   Insect Bite    Possible insect bit x 5 days little painful some drainage some swelling.     HPI Encounter Diagnoses  Name Primary?   Abscess Yes   Screening for colon cancer    Elevated BP without diagnosis of hypertension    Presents with a 5-day history of a sore on his right lower back.  There was an expanding zone of erythema that has decreased since the sore open and has been draining a puslike material spontaneously.  He wonders about a insect bite but felt no bite.  The lesion just seem to arise spontaneously.  No prior history of this.  He is quite anxious about it.   Review of Systems  Constitutional: Negative.   HENT: Negative.    Eyes:  Negative for blurred vision, discharge and redness.  Respiratory: Negative.    Cardiovascular: Negative.   Gastrointestinal:  Negative for abdominal pain.  Genitourinary: Negative.   Musculoskeletal: Negative.  Negative for myalgias.  Skin:  Negative for rash.  Neurological:  Negative for tingling, loss of consciousness and weakness.  Endo/Heme/Allergies:  Negative for polydipsia.     Current Outpatient Medications:    doxycycline (VIBRA-TABS) 100 MG tablet, Take 1 tablet (100 mg total) by mouth 2 (two) times daily for 10 days., Disp: 20 tablet, Rfl: 0   traZODone (DESYREL) 100 MG tablet, May take 50-100 mg nightly as needed for sleep., Disp: 90 tablet, Rfl: 3   Objective:     BP (!) 142/84   Pulse 64   Temp (!) 97.2 F (36.2 C) (Temporal)   Ht '5\' 9"'$  (1.753 m)   Wt 239 lb 9.6 oz (108.7 kg)   SpO2 98%   BMI 35.38 kg/m  BP Readings from Last 3 Encounters:  12/26/22 (!) 142/84  07/19/22 132/78  12/29/19 (!) 144/85   Wt Readings from Last 3 Encounters:  12/26/22 239 lb 9.6 oz (108.7 kg)  07/19/22 227 lb 9.6 oz (103.2 kg)  12/29/19 227 lb 1.2 oz  (103 kg)      Physical Exam Constitutional:      General: He is not in acute distress.    Appearance: Normal appearance. He is not ill-appearing, toxic-appearing or diaphoretic.  HENT:     Head: Normocephalic and atraumatic.     Right Ear: External ear normal.     Left Ear: External ear normal.  Eyes:     General: No scleral icterus.       Right eye: No discharge.        Left eye: No discharge.     Extraocular Movements: Extraocular movements intact.     Conjunctiva/sclera: Conjunctivae normal.  Pulmonary:     Effort: Pulmonary effort is normal. No respiratory distress.  Skin:    General: Skin is warm and dry.       Neurological:     Mental Status: He is alert and oriented to person, place, and time.  Psychiatric:        Mood and Affect: Mood normal.        Behavior: Behavior normal.      No results found for any visits on 12/26/22.    The 10-year ASCVD risk score (Arnett DK, et al., 2019) is: 2.4%    Assessment & Plan:   Abscess -  WOUND CULTURE -     Doxycycline Hyclate; Take 1 tablet (100 mg total) by mouth 2 (two) times daily for 10 days.  Dispense: 20 tablet; Refill: 0  Screening for colon cancer -     Ambulatory referral to Gastroenterology  Elevated BP without diagnosis of hypertension    Return in about 1 week (around 01/02/2023).  Referred patient again to GI for consultation for colonoscopy.  Will start doxycycline 100 twice daily for 10 days.  Sun precautions given.  He is somewhat anxious today about his presenting complaint.  This could be that he source of his elevated blood pressure.  Will see him back next week and recheck this in the wound.  Information was given on abscesses and preventing hypertension.  Libby Maw, MD

## 2022-12-29 LAB — WOUND CULTURE
MICRO NUMBER:: 14461139
SPECIMEN QUALITY:: ADEQUATE

## 2023-01-04 ENCOUNTER — Encounter: Payer: Self-pay | Admitting: Family Medicine

## 2023-01-04 ENCOUNTER — Ambulatory Visit (INDEPENDENT_AMBULATORY_CARE_PROVIDER_SITE_OTHER): Payer: BC Managed Care – PPO | Admitting: Family Medicine

## 2023-01-04 VITALS — BP 142/94 | Temp 97.8°F | Ht 69.0 in | Wt 238.6 lb

## 2023-01-04 DIAGNOSIS — L0291 Cutaneous abscess, unspecified: Secondary | ICD-10-CM | POA: Diagnosis not present

## 2023-01-04 NOTE — Progress Notes (Signed)
   Established Patient Office Visit   Subjective:  Patient ID: Richard Reynolds, male    DOB: September 06, 1971  Age: 52 y.o. MRN: [id number]  Chief Complaint  Patient presents with   Follow-up    Follow up on spider bite per patient much improved.     HPI Encounter Diagnoses  Name Primary?   Abscess Yes   Doing well.  Finished Doxy course of therapy.  Wound is sealed.  There is been no discharge fever or chills.  Decreasing erythema   Review of Systems  Skin:  Positive for itching. Negative for rash.     Current Outpatient Medications:    doxycycline (VIBRA-TABS) 100 MG tablet, Take 1 tablet (100 mg total) by mouth 2 (two) times daily for 10 days., Disp: 20 tablet, Rfl: 0   traZODone (DESYREL) 100 MG tablet, May take 50-100 mg nightly as needed for sleep., Disp: 90 tablet, Rfl: 3   Objective:     BP (!) 142/94 (BP Location: Left Arm, Patient Position: Sitting, Cuff Size: Large)   Temp 97.8 F (36.6 C)   Ht '5\' 9"'$  (1.753 m)   Wt 238 lb 9.6 oz (108.2 kg)   SpO2 97%   BMI 35.24 kg/m    Physical Exam Constitutional:      General: He is not in acute distress.    Appearance: Normal appearance. He is not ill-appearing, toxic-appearing or diaphoretic.  HENT:     Head: Normocephalic and atraumatic.     Right Ear: External ear normal.     Left Ear: External ear normal.  Eyes:     General: No scleral icterus.       Right eye: No discharge.        Left eye: No discharge.     Extraocular Movements: Extraocular movements intact.     Conjunctiva/sclera: Conjunctivae normal.  Pulmonary:     Effort: Pulmonary effort is normal. No respiratory distress.  Skin:    General: Skin is warm and dry.       Neurological:     Mental Status: He is alert and oriented to person, place, and time.  Psychiatric:        Mood and Affect: Mood normal.        Behavior: Behavior normal.      No results found for any visits on 01/04/23.    The 10-year ASCVD risk score (Arnett DK, et al., 2019)  is: 2.4%    Assessment & Plan:   Abscess    Return if symptoms worsen or fail to improve.    Libby Maw, MD

## 2023-07-09 ENCOUNTER — Other Ambulatory Visit: Payer: Self-pay

## 2023-07-09 ENCOUNTER — Other Ambulatory Visit: Payer: Self-pay | Admitting: Family Medicine

## 2023-07-09 DIAGNOSIS — F5101 Primary insomnia: Secondary | ICD-10-CM

## 2023-07-09 MED ORDER — TRAZODONE HCL 100 MG PO TABS: ORAL_TABLET | ORAL | 0 refills | Status: DC

## 2023-07-13 ENCOUNTER — Encounter: Payer: Self-pay | Admitting: Family Medicine

## 2023-07-13 ENCOUNTER — Ambulatory Visit: Payer: BC Managed Care – PPO | Admitting: Family Medicine

## 2023-07-13 VITALS — BP 128/84 | HR 65 | Temp 97.1°F | Ht 69.0 in | Wt 242.0 lb

## 2023-07-13 DIAGNOSIS — Z125 Encounter for screening for malignant neoplasm of prostate: Secondary | ICD-10-CM | POA: Diagnosis not present

## 2023-07-13 DIAGNOSIS — E7800 Pure hypercholesterolemia, unspecified: Secondary | ICD-10-CM

## 2023-07-13 DIAGNOSIS — F5101 Primary insomnia: Secondary | ICD-10-CM

## 2023-07-13 DIAGNOSIS — Z6835 Body mass index (BMI) 35.0-35.9, adult: Secondary | ICD-10-CM

## 2023-07-13 DIAGNOSIS — Z0000 Encounter for general adult medical examination without abnormal findings: Secondary | ICD-10-CM | POA: Diagnosis not present

## 2023-07-13 DIAGNOSIS — R7303 Prediabetes: Secondary | ICD-10-CM | POA: Diagnosis not present

## 2023-07-13 DIAGNOSIS — R5383 Other fatigue: Secondary | ICD-10-CM | POA: Diagnosis not present

## 2023-07-13 LAB — COMPREHENSIVE METABOLIC PANEL
ALT: 50 U/L (ref 0–53)
AST: 42 U/L — ABNORMAL HIGH (ref 0–37)
Albumin: 4.6 g/dL (ref 3.5–5.2)
Alkaline Phosphatase: 57 U/L (ref 39–117)
BUN: 15 mg/dL (ref 6–23)
CO2: 26 mEq/L (ref 19–32)
Calcium: 9.6 mg/dL (ref 8.4–10.5)
Chloride: 101 mEq/L (ref 96–112)
Creatinine, Ser: 0.99 mg/dL (ref 0.40–1.50)
GFR: 89.26 mL/min (ref >60.00)
Glucose, Bld: 116 mg/dL — ABNORMAL HIGH (ref 70–99)
Potassium: 5 mEq/L (ref 3.5–5.1)
Sodium: 138 mEq/L (ref 135–145)
Total Bilirubin: 0.8 mg/dL (ref 0.2–1.2)
Total Protein: 7.1 g/dL (ref 6.0–8.3)

## 2023-07-13 LAB — TSH: TSH: 1.28 u[IU]/mL (ref 0.35–5.50)

## 2023-07-13 LAB — HEMOGLOBIN A1C: Hgb A1c MFr Bld: 6.3 % (ref 4.6–6.5)

## 2023-07-13 LAB — CBC
HCT: 46.8 % (ref 39.0–52.0)
Hemoglobin: 15.4 g/dL (ref 13.0–17.0)
MCHC: 32.8 g/dL (ref 30.0–36.0)
MCV: 88.2 fl (ref 78.0–100.0)
Platelets: 200 10*3/uL (ref 150.0–400.0)
RBC: 5.31 Mil/uL (ref 4.22–5.81)
RDW: 13.6 % (ref 11.5–15.5)
WBC: 5.1 10*3/uL (ref 4.0–10.5)

## 2023-07-13 LAB — URINALYSIS, ROUTINE W REFLEX MICROSCOPIC
Bilirubin Urine: NEGATIVE
Hgb urine dipstick: NEGATIVE
Ketones, ur: NEGATIVE
Leukocytes,Ua: NEGATIVE
Nitrite: NEGATIVE
RBC / HPF: NONE SEEN (ref 0–?)
Specific Gravity, Urine: 1.025 (ref 1.000–1.030)
Total Protein, Urine: NEGATIVE
Urine Glucose: NEGATIVE
Urobilinogen, UA: 0.2 (ref 0.0–1.0)
pH: 5.5 (ref 5.0–8.0)

## 2023-07-13 LAB — LIPID PANEL
Cholesterol: 228 mg/dL — ABNORMAL HIGH (ref 0–200)
HDL: 62.2 mg/dL (ref >39.00)
LDL Cholesterol: 149 mg/dL — ABNORMAL HIGH (ref 0–99)
NonHDL: 165.88
Total CHOL/HDL Ratio: 4
Triglycerides: 86 mg/dL (ref 0.0–149.0)
VLDL: 17.2 mg/dL (ref 0.0–40.0)

## 2023-07-13 LAB — PSA: PSA: 1.01 ng/mL (ref 0.10–4.00)
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
## 2023-07-16 ENCOUNTER — Telehealth: Payer: Self-pay | Admitting: Family Medicine

## 2023-07-16 DIAGNOSIS — R7303 Prediabetes: Secondary | ICD-10-CM

## 2023-07-16 MED ORDER — METFORMIN HCL ER 500 MG PO TB24: 500.0000 mg | ORAL_TABLET | Freq: Every evening | ORAL | 1 refills | Status: DC

## 2023-07-16 NOTE — Telephone Encounter (Signed)
Pt was seen on Friday 07/13/23 by Dr Doreene Burke and is under the impression he is suppose to be getting a script to handle his glucose level.   [id number] [place], [place] - [address] AT [facility] OF [address] & [address] [place] [address] Phone: [phone]  Fax: [phone] DEA #: [id number]   Pt at [phone]

## 2023-07-16 NOTE — Addendum Note (Signed)
Addended by: Andrez Grime on: 07/16/2023 12:28 PM   Modules accepted: Orders

## 2023-07-17 ENCOUNTER — Telehealth: Payer: Self-pay | Admitting: Family Medicine

## 2023-07-17 NOTE — Telephone Encounter (Signed)
Pt is wanting a cb concerning his most recent lab results. Please advise at [phone]

## 2023-07-18 ENCOUNTER — Telehealth: Payer: Self-pay | Admitting: Family Medicine

## 2023-07-18 NOTE — Telephone Encounter (Signed)
Left a voicemail to give our office a call back to get his lab results.

## 2023-07-18 NOTE — Telephone Encounter (Signed)
Pt returned Ashley's call about his labs. I told him you would call him back.

## 2023-08-09 ENCOUNTER — Inpatient Hospital Stay (HOSPITAL_COMMUNITY)
Admission: EM | Admit: 2023-08-09 | Discharge: 2023-08-13 | DRG: 184 | Disposition: A | Discharge status: Home / Self Care | Payer: BC Managed Care – PPO | Attending: Surgery | Admitting: Surgery

## 2023-08-09 ENCOUNTER — Emergency Department (HOSPITAL_COMMUNITY): Payer: BC Managed Care – PPO

## 2023-08-09 ENCOUNTER — Encounter (HOSPITAL_COMMUNITY): Payer: Self-pay | Admitting: Emergency Medicine

## 2023-08-09 ENCOUNTER — Other Ambulatory Visit: Payer: Self-pay

## 2023-08-09 DIAGNOSIS — M25571 Pain in right ankle and joints of right foot: Secondary | ICD-10-CM | POA: Diagnosis present

## 2023-08-09 DIAGNOSIS — S42111A Displaced fracture of body of scapula, right shoulder, initial encounter for closed fracture: Secondary | ICD-10-CM | POA: Diagnosis present

## 2023-08-09 DIAGNOSIS — S2243XA Multiple fractures of ribs, bilateral, initial encounter for closed fracture: Secondary | ICD-10-CM | POA: Diagnosis present

## 2023-08-09 DIAGNOSIS — S2249XA Multiple fractures of ribs, unspecified side, initial encounter for closed fracture: Secondary | ICD-10-CM | POA: Diagnosis present

## 2023-08-09 DIAGNOSIS — S32019A Unspecified fracture of first lumbar vertebra, initial encounter for closed fracture: Secondary | ICD-10-CM | POA: Diagnosis present

## 2023-08-09 DIAGNOSIS — Z7984 Long term (current) use of oral hypoglycemic drugs: Secondary | ICD-10-CM

## 2023-08-09 DIAGNOSIS — S40011A Contusion of right shoulder, initial encounter: Secondary | ICD-10-CM | POA: Diagnosis present

## 2023-08-09 DIAGNOSIS — Z79899 Other long term (current) drug therapy: Secondary | ICD-10-CM | POA: Diagnosis not present

## 2023-08-09 DIAGNOSIS — S42101A Fracture of unspecified part of scapula, right shoulder, initial encounter for closed fracture: Secondary | ICD-10-CM

## 2023-08-09 DIAGNOSIS — Z23 Encounter for immunization: Secondary | ICD-10-CM

## 2023-08-09 DIAGNOSIS — Y92410 Unspecified street and highway as the place of occurrence of the external cause: Secondary | ICD-10-CM | POA: Diagnosis not present

## 2023-08-09 DIAGNOSIS — S270XXA Traumatic pneumothorax, initial encounter: Secondary | ICD-10-CM | POA: Diagnosis present

## 2023-08-09 DIAGNOSIS — S43101A Unspecified dislocation of right acromioclavicular joint, initial encounter: Secondary | ICD-10-CM | POA: Diagnosis present

## 2023-08-09 DIAGNOSIS — S0081XA Abrasion of other part of head, initial encounter: Secondary | ICD-10-CM | POA: Diagnosis present

## 2023-08-09 DIAGNOSIS — R001 Bradycardia, unspecified: Secondary | ICD-10-CM | POA: Diagnosis present

## 2023-08-09 LAB — I-STAT CHEM 8, ED
BUN: 21 mg/dL — ABNORMAL HIGH (ref 6–20)
Calcium, Ion: 1.04 mmol/L — ABNORMAL LOW (ref 1.15–1.40)
Chloride: 102 mmol/L (ref 98–111)
Creatinine, Ser: 1.1 mg/dL (ref 0.61–1.24)
Glucose, Bld: 136 mg/dL — ABNORMAL HIGH (ref 70–99)
HCT: 44 % (ref 39.0–52.0)
Hemoglobin: 15 g/dL (ref 13.0–17.0)
Potassium: 3.8 mmol/L (ref 3.5–5.1)
Sodium: 139 mmol/L (ref 135–145)
TCO2: 27 mmol/L (ref 22–32)

## 2023-08-09 LAB — COMPREHENSIVE METABOLIC PANEL
ALT: 44 U/L (ref 0–44)
AST: 58 U/L — ABNORMAL HIGH (ref 15–41)
Albumin: 3.9 g/dL (ref 3.5–5.0)
Alkaline Phosphatase: 42 U/L (ref 38–126)
Anion gap: 12 (ref 5–15)
BUN: 19 mg/dL (ref 6–20)
CO2: 25 mmol/L (ref 22–32)
Calcium: 8.7 mg/dL — ABNORMAL LOW (ref 8.9–10.3)
Chloride: 101 mmol/L (ref 98–111)
Creatinine, Ser: 1.13 mg/dL (ref 0.61–1.24)
GFR, Estimated: >60 mL/min (ref >60)
Glucose, Bld: 139 mg/dL — ABNORMAL HIGH (ref 70–99)
Potassium: 3.8 mmol/L (ref 3.5–5.1)
Sodium: 138 mmol/L (ref 135–145)
Total Bilirubin: 0.4 mg/dL (ref 0.3–1.2)
Total Protein: 6.4 g/dL — ABNORMAL LOW (ref 6.5–8.1)

## 2023-08-09 LAB — CBC
HCT: 44.2 % (ref 39.0–52.0)
Hemoglobin: 14.7 g/dL (ref 13.0–17.0)
MCH: 28.4 pg (ref 26.0–34.0)
MCHC: 33.3 g/dL (ref 30.0–36.0)
MCV: 85.3 fL (ref 80.0–100.0)
Platelets: 226 10*3/uL (ref 150–400)
RBC: 5.18 MIL/uL (ref 4.22–5.81)
RDW: 12.2 % (ref 11.5–15.5)
WBC: 8.5 10*3/uL (ref 4.0–10.5)
nRBC: 0 % (ref 0.0–0.2)

## 2023-08-09 LAB — ETHANOL: Alcohol, Ethyl (B): <10 mg/dL (ref <10)

## 2023-08-09 LAB — SAMPLE TO BLOOD BANK

## 2023-08-09 LAB — PROTIME-INR
INR: 1 (ref 0.8–1.2)
Prothrombin Time: 13.7 s (ref 11.4–15.2)

## 2023-08-09 MED ORDER — TETANUS-DIPHTH-ACELL PERTUSSIS 5-2.5-18.5 LF-MCG/0.5 IM SUSY
PREFILLED_SYRINGE | INTRAMUSCULAR | Status: AC
  Administered 2023-08-09: 0.5 mL via INTRAMUSCULAR
  Filled 2023-08-09: qty 0.5

## 2023-08-09 MED ORDER — HYDROMORPHONE HCL 1 MG/ML IJ SOLN
INTRAMUSCULAR | Status: AC
  Filled 2023-08-09: qty 1

## 2023-08-09 MED ORDER — HYDROMORPHONE HCL 1 MG/ML IJ SOLN
1.0000 mg | INTRAMUSCULAR | Status: DC | PRN
  Administered 2023-08-10 (×2): 1 mg via INTRAVENOUS
  Filled 2023-08-09 (×2): qty 1

## 2023-08-09 MED ORDER — DOCUSATE SODIUM 100 MG PO CAPS
100.0000 mg | ORAL_CAPSULE | Freq: Two times a day (BID) | ORAL | Status: DC
  Administered 2023-08-09 – 2023-08-13 (×8): 100 mg via ORAL
  Filled 2023-08-09 (×8): qty 1

## 2023-08-09 MED ORDER — OXYCODONE HCL 5 MG PO TABS
5.0000 mg | ORAL_TABLET | ORAL | Status: DC | PRN
  Administered 2023-08-09 – 2023-08-10 (×2): 5 mg via ORAL
  Filled 2023-08-09 (×2): qty 1

## 2023-08-09 MED ORDER — LACTATED RINGERS IV BOLUS
1000.0000 mL | Freq: Once | INTRAVENOUS | Status: AC
  Administered 2023-08-09: 1000 mL via INTRAVENOUS

## 2023-08-09 MED ORDER — METOPROLOL TARTRATE 5 MG/5ML IV SOLN: 5.0000 mg | Freq: Four times a day (QID) | INTRAVENOUS | Status: DC | PRN

## 2023-08-09 MED ORDER — ONDANSETRON 4 MG PO TBDP: 4.0000 mg | ORAL_TABLET | Freq: Four times a day (QID) | ORAL | Status: DC | PRN

## 2023-08-09 MED ORDER — ENOXAPARIN SODIUM 30 MG/0.3ML IJ SOSY
30.0000 mg | PREFILLED_SYRINGE | Freq: Two times a day (BID) | INTRAMUSCULAR | Status: DC
  Administered 2023-08-10 – 2023-08-13 (×7): 30 mg via SUBCUTANEOUS
  Filled 2023-08-09 (×7): qty 0.3

## 2023-08-09 MED ORDER — METHOCARBAMOL 1000 MG/10ML IJ SOLN
500.0000 mg | Freq: Three times a day (TID) | INTRAVENOUS | Status: DC
  Filled 2023-08-09: qty 5

## 2023-08-09 MED ORDER — METHOCARBAMOL 500 MG PO TABS
500.0000 mg | ORAL_TABLET | Freq: Three times a day (TID) | ORAL | Status: DC
  Administered 2023-08-09: 500 mg via ORAL
  Filled 2023-08-09 (×2): qty 1

## 2023-08-09 MED ORDER — SODIUM CHLORIDE 0.9 % IV SOLN: INTRAVENOUS | Status: DC

## 2023-08-09 MED ORDER — IOHEXOL 350 MG/ML SOLN
75.0000 mL | Freq: Once | INTRAVENOUS | Status: AC | PRN
  Administered 2023-08-09: 75 mL via INTRAVENOUS

## 2023-08-09 MED ORDER — HYDROMORPHONE HCL 1 MG/ML IJ SOLN
1.0000 mg | Freq: Once | INTRAMUSCULAR | Status: AC
  Administered 2023-08-09: 1 mg via INTRAVENOUS

## 2023-08-09 MED ORDER — POLYETHYLENE GLYCOL 3350 17 G PO PACK: 17.0000 g | PACK | Freq: Every day | ORAL | Status: DC | PRN

## 2023-08-09 MED ORDER — ONDANSETRON HCL 4 MG/2ML IJ SOLN: 4.0000 mg | Freq: Four times a day (QID) | INTRAMUSCULAR | Status: DC | PRN

## 2023-08-09 MED ORDER — CEFAZOLIN SODIUM-DEXTROSE 2-4 GM/100ML-% IV SOLN
2.0000 g | Freq: Once | INTRAVENOUS | Status: AC
  Administered 2023-08-09: 2 g via INTRAVENOUS

## 2023-08-09 MED ORDER — HYDRALAZINE HCL 20 MG/ML IJ SOLN: 10.0000 mg | INTRAMUSCULAR | Status: DC | PRN

## 2023-08-09 MED ORDER — ACETAMINOPHEN 500 MG PO TABS
1000.0000 mg | ORAL_TABLET | Freq: Four times a day (QID) | ORAL | Status: DC
  Administered 2023-08-10 – 2023-08-13 (×13): 1000 mg via ORAL
  Filled 2023-08-09 (×15): qty 2

## 2023-08-09 MED ORDER — TETANUS-DIPHTH-ACELL PERTUSSIS 5-2.5-18.5 LF-MCG/0.5 IM SUSY: 0.5000 mL | PREFILLED_SYRINGE | Freq: Once | INTRAMUSCULAR | Status: AC
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
## 2023-08-09 NOTE — TOC CAGE-AID Note (Signed)
Transition of Care [facility]) - CAGE-AID Screening   Patient Details  Name: Richard Reynolds MRN: [id number] Date of Birth: 16-Aug-1971  Transition of Care [facility]) CM/SW Contact:    Katha Hamming, RN Phone Number: 08/09/2023, 11:12 PM  CAGE-AID Screening:    Have You Ever Felt You Ought to Cut Down on Your Drinking or Drug Use?: No Have People Annoyed You By Critizing Your Drinking Or Drug Use?: No Have You Felt Bad Or Guilty About Your Drinking Or Drug Use?: No Have You Ever Had a Drink or Used Drugs First Thing In The Morning to Steady Your Nerves or to Get Rid of a Hangover?: No CAGE-AID Score: 0  Substance Abuse Education Offered: No
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
## 2023-08-09 NOTE — ED Notes (Signed)
Per Dr. Silverio Lay, defer labs for CT trauma scans

## 2023-08-09 NOTE — Progress Notes (Signed)
Orthopedic Tech Progress Note Patient Details:  Richard Reynolds Feb 26, 1971 [id number]  Patient ID: Richard Reynolds, male   DOB: 10-Mar-1971, 52 y.o.   MRN: [id number] Level II' not needed. Darleen Crocker 08/09/2023, 9:34 PM

## 2023-08-09 NOTE — ED Notes (Signed)
Pt returned from CT °
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
## 2023-08-09 NOTE — Progress Notes (Signed)
I responded to a level I. He was alert, oriented, had baseline bradycardia but otherwise normal vitals. He was downgraded to level II
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
## 2023-08-10 ENCOUNTER — Inpatient Hospital Stay (HOSPITAL_COMMUNITY): Payer: BC Managed Care – PPO

## 2023-08-10 LAB — BASIC METABOLIC PANEL
Anion gap: 10 (ref 5–15)
BUN: 17 mg/dL (ref 6–20)
CO2: 24 mmol/L (ref 22–32)
Calcium: 8.3 mg/dL — ABNORMAL LOW (ref 8.9–10.3)
Chloride: 100 mmol/L (ref 98–111)
Creatinine, Ser: 1.01 mg/dL (ref 0.61–1.24)
GFR, Estimated: >60 mL/min (ref >60)
Glucose, Bld: 154 mg/dL — ABNORMAL HIGH (ref 70–99)
Potassium: 4.1 mmol/L (ref 3.5–5.1)
Sodium: 134 mmol/L — ABNORMAL LOW (ref 135–145)

## 2023-08-10 LAB — CBC
HCT: 40.4 % (ref 39.0–52.0)
Hemoglobin: 13.4 g/dL (ref 13.0–17.0)
MCH: 28.2 pg (ref 26.0–34.0)
MCHC: 33.2 g/dL (ref 30.0–36.0)
MCV: 85.1 fL (ref 80.0–100.0)
Platelets: 201 10*3/uL (ref 150–400)
RBC: 4.75 MIL/uL (ref 4.22–5.81)
RDW: 12.5 % (ref 11.5–15.5)
WBC: 12.3 10*3/uL — ABNORMAL HIGH (ref 4.0–10.5)
nRBC: 0 % (ref 0.0–0.2)

## 2023-08-10 LAB — CREATININE, SERUM
Creatinine, Ser: 1.02 mg/dL (ref 0.61–1.24)
GFR, Estimated: >60 mL/min (ref >60)

## 2023-08-10 LAB — HIV ANTIBODY (ROUTINE TESTING W REFLEX): HIV Screen 4th Generation wRfx: NONREACTIVE

## 2023-08-10 MED ORDER — METHOCARBAMOL 750 MG PO TABS
750.0000 mg | ORAL_TABLET | Freq: Three times a day (TID) | ORAL | Status: DC
  Administered 2023-08-10 – 2023-08-11 (×3): 750 mg via ORAL
  Filled 2023-08-10 (×3): qty 1

## 2023-08-10 MED ORDER — HYDROMORPHONE HCL 1 MG/ML IJ SOLN
0.5000 mg | INTRAMUSCULAR | Status: DC | PRN
  Administered 2023-08-10 – 2023-08-12 (×2): 1 mg via INTRAVENOUS
  Filled 2023-08-10 (×2): qty 1

## 2023-08-10 MED ORDER — OXYCODONE HCL 5 MG PO TABS
5.0000 mg | ORAL_TABLET | ORAL | Status: DC | PRN
  Administered 2023-08-10 – 2023-08-13 (×8): 10 mg via ORAL
  Filled 2023-08-10 (×9): qty 2
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
## 2023-08-11 MED ORDER — KETOROLAC TROMETHAMINE 30 MG/ML IJ SOLN
30.0000 mg | Freq: Three times a day (TID) | INTRAMUSCULAR | Status: DC
  Administered 2023-08-11 – 2023-08-13 (×7): 30 mg via INTRAVENOUS
  Filled 2023-08-11 (×7): qty 1

## 2023-08-11 MED ORDER — METHOCARBAMOL 500 MG PO TABS
1000.0000 mg | ORAL_TABLET | Freq: Four times a day (QID) | ORAL | Status: DC
  Administered 2023-08-11 – 2023-08-13 (×11): 1000 mg via ORAL
  Filled 2023-08-11 (×11): qty 2

## 2023-08-11 NOTE — Plan of Care (Signed)
Patient is AOX4. Able to use call bell appropriately. Continues with pain throughout shift. Prefers oral oxycodone. Medication changes by provider. Patient reports better pain control with new regimen. Wound care done this shift as ordered. Was able to work with PT this shift, however movement remains very limited. Was placed on supplemental oxygen via [place] @ 3L.  Patient was educated on the use of the incentive spirometer multiple times during this shift. Patient verbalizes understanding.  Problem: Education: Goal: Knowledge of General Education information will improve Description: Including pain rating scale, medication(s)/side effects and non-pharmacologic comfort measures Outcome: Progressing   Problem: Health Behavior/Discharge Planning: Goal: Ability to manage health-related needs will improve Outcome: Progressing   Problem: Clinical Measurements: Goal: Ability to maintain clinical measurements within normal limits will improve Outcome: Progressing Goal: Will remain free from infection Outcome: Progressing Goal: Diagnostic test results will improve Outcome: Progressing Goal: Respiratory complications will improve Outcome: Progressing Goal: Cardiovascular complication will be avoided Outcome: Progressing   Problem: Activity: Goal: Risk for activity intolerance will decrease Outcome: Progressing   Problem: Nutrition: Goal: Adequate nutrition will be maintained Outcome: Progressing   Problem: Coping: Goal: Level of anxiety will decrease Outcome: Progressing   Problem: Elimination: Goal: Will not experience complications related to bowel motility Outcome: Progressing Goal: Will not experience complications related to urinary retention Outcome: Progressing   Problem: Pain Managment: Goal: General experience of comfort will improve Outcome: Progressing   Problem: Safety: Goal: Ability to remain free from injury will improve Outcome: Progressing   Problem: Skin  Integrity: Goal: Risk for impaired skin integrity will decrease Outcome: Progressing

## 2023-08-11 NOTE — Progress Notes (Signed)
Pt refused wound dressing as he request to do it this morning. Will endorse to day shift nurse. Will continue to monitor.
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
## 2023-08-12 NOTE — Progress Notes (Signed)
   08/12/23 1250  Mobility  Activity Ambulated with assistance in room;Ambulated with assistance to bathroom;Dangled on edge of bed  Level of Assistance Contact guard assist, steadying assist (ModA for bed mobility, MinA STS)  Assistive Device Antony Salmon [facility] for STS only)  Distance Ambulated (ft) 15 ft  RUE Weight Bearing WBAT  Activity Response Tolerated fair  Mobility Referral Yes  $Mobility charge 1 Mobility  Mobility Specialist Start Time (ACUTE ONLY) 1110  Mobility Specialist Stop Time (ACUTE ONLY) 1135  Mobility Specialist Time Calculation (min) (ACUTE ONLY) 25 min   Mobility Specialist: Progress Note  Pt agreeable to mobility session - received in bed.  ModA for bed mobility to EOB. MinA using stedy for STS. CG for ambulation to BR with no AD. Pt with high anxiety and nervousness about mobility. Pt left in BR with all needs met. RN and wife present in room.  Barnie Mort, BS Mobility Specialist Please contact via SecureChat or Rehab office at [phone].
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
## 2023-08-13 ENCOUNTER — Other Ambulatory Visit (HOSPITAL_COMMUNITY): Payer: Self-pay

## 2023-08-13 MED ORDER — POLYETHYLENE GLYCOL 3350 17 G PO PACK: 17.0000 g | PACK | Freq: Two times a day (BID) | ORAL | Status: DC | PRN

## 2023-08-13 MED ORDER — POLYETHYLENE GLYCOL 3350 17 G PO PACK
17.0000 g | PACK | Freq: Two times a day (BID) | ORAL | Status: DC
  Filled 2023-08-13: qty 1

## 2023-08-13 MED ORDER — ACETAMINOPHEN 500 MG PO TABS: 1000.0000 mg | ORAL_TABLET | Freq: Three times a day (TID) | ORAL | Status: DC | PRN

## 2023-08-13 MED ORDER — LIDOCAINE 5 % EX PTCH
1.0000 | MEDICATED_PATCH | CUTANEOUS | Status: DC
  Filled 2023-08-13: qty 1

## 2023-08-13 MED ORDER — OXYCODONE HCL 5 MG PO TABS
5.0000 mg | ORAL_TABLET | Freq: Four times a day (QID) | ORAL | 0 refills | Status: DC | PRN
Start: 2023-08-13 — End: 2024-05-28
  Filled 2023-08-13: qty 20, 5d supply, fill #0

## 2023-08-13 MED ORDER — METHOCARBAMOL 500 MG PO TABS
1000.0000 mg | ORAL_TABLET | Freq: Three times a day (TID) | ORAL | 0 refills | Status: DC | PRN
  Filled 2023-08-13: qty 60, 10d supply, fill #0
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
## 2023-08-13 NOTE — Progress Notes (Signed)
   08/13/23 1145  Mobility  Activity Ambulated with assistance in room;Transferred from chair to bed;Ambulated with assistance to bathroom  Level of Assistance Standby assist, set-up cues, supervision of patient - no hands on (MinA STS)  Assistive Device None  Distance Ambulated (ft) 20 ft  RUE Weight Bearing WBAT  Activity Response Tolerated well  Mobility Referral Yes  $Mobility charge 1 Mobility  Mobility Specialist Start Time (ACUTE ONLY) 1117  Mobility Specialist Stop Time (ACUTE ONLY) 1137  Mobility Specialist Time Calculation (min) (ACUTE ONLY) 20 min   Mobility Specialist: Progress Note  Pt agreeable to mobility session - received in chair. Required MinA STS with no AD. Pt had no complaints. Required SV during ambulation to and from BR and to bed. Pt returned to bed with all needs met - call bell within reach. Daughter present.   Barnie Mort, BS Mobility Specialist Please contact via SecureChat or Rehab office at [phone].
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
## 2023-08-13 NOTE — Progress Notes (Signed)
   08/13/23 1106  Mobility  Activity Ambulated with assistance in hallway  Level of Assistance Standby assist, set-up cues, supervision of patient - no hands on (MinA STS)  Assistive Device None  Distance Ambulated (ft) 170 ft  RUE Weight Bearing WBAT  Activity Response Tolerated fair  Mobility Referral Yes  $Mobility charge 1 Mobility  Mobility Specialist Start Time (ACUTE ONLY) 1028  Mobility Specialist Stop Time (ACUTE ONLY) 1048  Mobility Specialist Time Calculation (min) (ACUTE ONLY) 20 min   Mobility Specialist: Progress Note  Pt requested mobility session - received standing in room with NT present. Required SV with no AD with c/o R. shoulder pain 5/10 and 6/10 pain scale. Required MinA from standing to sitting, pt slowly lowered himself to the chair. Pt returned to chair with all needs met - call bell within reach.   Barnie Mort, BS Mobility Specialist Please contact via SecureChat or Rehab office at [phone].

## 2023-08-13 NOTE — Discharge Instructions (Addendum)
Orthopedics recommends weight bearing as tolerated to the right upper extremity for your scapula fracture  RIB FRACTURES  HOME INSTRUCTIONS   PAIN CONTROL:  Pain is best controlled by a usual combination of three different methods TOGETHER:  Ice/Heat Over the counter pain medication Prescription pain medication You may experience some swelling and bruising in area of broken ribs. Ice packs or heating pads (30-60 minutes up to 6 times a day) will help. Use ice for the first few days to help decrease swelling and bruising, then switch to heat to help relax tight/sore spots and speed recovery. Some people prefer to use ice alone, heat alone, alternating between ice & heat. Experiment to what works for you. Swelling and bruising can take several weeks to resolve.  It is helpful to take an over-the-counter pain medication regularly for the first few weeks. Choose one of the following that works best for you:  Naproxen (Aleve, etc) Two 220mg  tabs twice a day Ibuprofen (Advil, etc) Three 200mg  tabs four times a day (every meal & bedtime) Acetaminophen (Tylenol, etc) 500-650mg  four times a day (every meal & bedtime) A prescription for pain medication (such as oxycodone, hydrocodone, etc) may be given to you upon discharge. Take your pain medication as prescribed.  If you are having problems/concerns with the prescription medicine (does not control pain, nausea, vomiting, rash, itching, etc), please call us [phone] to see if we need to switch you to a different pain medicine that will work better for you and/or control your side effect better. If you need a refill on your pain medication, please contact your pharmacy. They will contact our office to request authorization. Prescriptions will not be filled after 5 pm or on week-ends. Avoid getting constipated. When taking pain medications, it is common to experience some constipation. Increasing fluid intake and taking a fiber supplement (such as  Metamucil, Citrucel, FiberCon, MiraLax, etc) 1-2 times a day regularly will usually help prevent this problem from occurring. A mild laxative (prune juice, Milk of Magnesia, MiraLax, etc) should be taken according to package directions if there are no bowel movements after 48 hours.  Watch out for diarrhea. If you have many loose bowel movements, simplify your diet to bland foods & liquids for a few days. Stop any stool softeners and decrease your fiber supplement. Switching to mild anti-diarrheal medications (Kayopectate, Pepto Bismol) can help. If this worsens or does not improve, please call us. FOLLOW UP  If a follow up appointment is needed one will be scheduled for you. If none is needed with our trauma team, please follow up with your primary care provider within 2-3 weeks from discharge. Please call [facility] at [phone] if you have any questions about follow up.  If you have any orthopedic or other injuries you will need to follow up as outlined in your follow up instructions.   WHEN TO CALL us [phone]:  Poor pain control Reactions / problems with new medications (rash/itching, nausea, etc)  Fever over 101.5 F (38.5 C) Worsening swelling or bruising Worsening pain, productive cough, difficulty breathing or any other concerning symptoms  The clinic staff is available to answer your questions during regular business hours (8:30am-5pm). Please don't hesitate to call and ask to speak to one of our nurses for clinical concerns.  If you have a medical emergency, go to the nearest emergency room or call 911.  A surgeon from [facility] is always on call at the [facility] [place]  [address], [place], [place] [address] ?  MAIN: [phone] ? TOLL FREE: [phone] ?  FAX [phone]  [email or web address]      Information on Rib Fractures  A rib fracture is a break or crack in one of the bones of the ribs.  The ribs are long, curved bones that wrap around your chest and attach to your spine and your breastbone. The ribs protect your heart, lungs, and other organs in the chest. A broken or cracked rib is often painful but is not usually serious. Most rib fractures heal on their own over time. However, rib fractures can be more serious if multiple ribs are broken or if broken ribs move out of place and push against other structures or organs. What are the causes? This condition is caused by: Repetitive movements with high force, such as pitching a baseball or having severe coughing spells. A direct blow to the chest, such as a sports injury, a car accident, or a fall. Cancer that has spread to the bones, which can weaken bones and cause them to break. What are the signs or symptoms? Symptoms of this condition include: Pain when you breathe in or cough. Pain when someone presses on the injured area. Feeling short of breath. How is this diagnosed? This condition is diagnosed with a physical exam and medical history. Imaging tests may also be done, such as: Chest X-ray. CT scan. MRI. Bone scan. Chest ultrasound. How is this treated? Treatment for this condition depends on the severity of the fracture. Most rib fractures usually heal on their own in 1-3 months. Sometimes healing takes longer if there is a cough that does not stop or if there are other activities that make the injury worse (aggravating factors). While you heal, you will be given medicines to control the pain. You will also be taught deep breathing exercises. Severe injuries may require hospitalization or surgery. Follow these instructions at home: Managing pain, stiffness, and swelling If directed, apply ice to the injured area. Put ice in a plastic bag. Place a towel between your skin and the bag. Leave the ice on for 20 minutes, 2-3 times a day. Take over-the-counter and prescription medicines only as told by your health care  provider. Activity Avoid a lot of activity and any activities or movements that cause pain. Be careful during activities and avoid bumping the injured rib. Slowly increase your activity as told by your health care provider. General instructions Do deep breathing exercises as told by your health care provider. This helps prevent pneumonia, which is a common complication of a broken rib. Your health care provider may instruct you to: Take deep breaths several times a day. Try to cough several times a day, holding a pillow against the injured area. Use a device called incentive spirometer to practice deep breathing several times a day. Drink enough fluid to keep your urine pale yellow. Do not wear a rib belt or binder. These restrict breathing, which can lead to pneumonia. Keep all follow-up visits as told by your health care provider. This is important. Contact a health care provider if: You have a fever. Get help right away if: You have difficulty breathing or you are short of breath. You develop a cough that does not stop, or you cough up thick or bloody sputum. You have nausea, vomiting, or pain in your abdomen. Your pain gets worse and medicine does not help. Summary A rib fracture is a break  or crack in one of the bones of the ribs. A broken or cracked rib is often painful but is not usually serious. Most rib fractures heal on their own over time. Treatment for this condition depends on the severity of the fracture. Avoid a lot of activity and any activities or movements that cause pain. This information is not intended to replace advice given to you by your health care provider. Make sure you discuss any questions you have with your health care provider. Document Released: 11/20/2005 Document Revised: 02/19/2017 Document Reviewed: 02/19/2017 Elsevier Interactive Patient Education  2019 [employer].

## 2023-08-14 ENCOUNTER — Other Ambulatory Visit (HOSPITAL_COMMUNITY): Payer: Self-pay

## 2023-08-14 ENCOUNTER — Encounter: Payer: Self-pay | Admitting: Family Medicine

## 2023-08-15 ENCOUNTER — Telehealth: Payer: Self-pay
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
## 2023-08-22 ENCOUNTER — Encounter: Payer: Self-pay | Admitting: Family Medicine

## 2023-08-22 ENCOUNTER — Ambulatory Visit: Payer: BC Managed Care – PPO | Admitting: Physical Therapy

## 2023-08-22 ENCOUNTER — Ambulatory Visit: Payer: BC Managed Care – PPO | Admitting: Family Medicine

## 2023-08-22 VITALS — BP 128/88 | HR 73 | Temp 98.5°F | Ht 69.0 in | Wt 233.0 lb

## 2023-08-22 DIAGNOSIS — Z09 Encounter for follow-up examination after completed treatment for conditions other than malignant neoplasm: Secondary | ICD-10-CM

## 2023-08-22 DIAGNOSIS — M25571 Pain in right ankle and joints of right foot: Secondary | ICD-10-CM | POA: Diagnosis not present

## 2023-08-22 DIAGNOSIS — Z23 Encounter for immunization: Secondary | ICD-10-CM | POA: Diagnosis not present

## 2023-08-24 ENCOUNTER — Ambulatory Visit (INDEPENDENT_AMBULATORY_CARE_PROVIDER_SITE_OTHER)
Admission: RE | Admit: 2023-08-24 | Discharge: 2023-08-24 | Disposition: A | Discharge status: Home / Self Care | Payer: BC Managed Care – PPO | Source: Ambulatory Visit | Attending: Family Medicine | Admitting: Family Medicine

## 2023-08-24 DIAGNOSIS — M25571 Pain in right ankle and joints of right foot: Secondary | ICD-10-CM | POA: Diagnosis not present

## 2023-08-25 ENCOUNTER — Other Ambulatory Visit (HOSPITAL_COMMUNITY): Payer: Self-pay

## 2023-08-28 ENCOUNTER — Ambulatory Visit: Payer: BC Managed Care – PPO | Admitting: Physical Therapy

## 2023-08-31 ENCOUNTER — Ambulatory Visit: Payer: BC Managed Care – PPO | Attending: Physician Assistant

## 2023-08-31 DIAGNOSIS — S42101D Fracture of unspecified part of scapula, right shoulder, subsequent encounter for fracture with routine healing: Secondary | ICD-10-CM | POA: Insufficient documentation

## 2023-08-31 DIAGNOSIS — S2243XD Multiple fractures of ribs, bilateral, subsequent encounter for fracture with routine healing: Secondary | ICD-10-CM | POA: Diagnosis not present

## 2023-08-31 DIAGNOSIS — M25611 Stiffness of right shoulder, not elsewhere classified: Secondary | ICD-10-CM

## 2023-08-31 DIAGNOSIS — M25511 Pain in right shoulder: Secondary | ICD-10-CM

## 2023-08-31 DIAGNOSIS — M6281 Muscle weakness (generalized): Secondary | ICD-10-CM | POA: Diagnosis present

## 2023-08-31 DIAGNOSIS — S2241XD Multiple fractures of ribs, right side, subsequent encounter for fracture with routine healing: Secondary | ICD-10-CM

## 2023-09-04 NOTE — Addendum Note (Signed)
Addended by: Andrez Grime on: 09/04/2023 02:10 PM   Modules accepted: Orders

## 2023-09-10 ENCOUNTER — Ambulatory Visit: Payer: BC Managed Care – PPO

## 2023-09-12 ENCOUNTER — Ambulatory Visit: Payer: BC Managed Care – PPO | Attending: Physician Assistant

## 2023-09-12 ENCOUNTER — Encounter: Payer: Self-pay | Admitting: Family Medicine

## 2023-09-12 ENCOUNTER — Other Ambulatory Visit: Payer: Self-pay

## 2023-09-12 DIAGNOSIS — M25511 Pain in right shoulder: Secondary | ICD-10-CM | POA: Diagnosis present

## 2023-09-12 DIAGNOSIS — M25611 Stiffness of right shoulder, not elsewhere classified: Secondary | ICD-10-CM

## 2023-09-12 DIAGNOSIS — S2241XD Multiple fractures of ribs, right side, subsequent encounter for fracture with routine healing: Secondary | ICD-10-CM | POA: Diagnosis not present

## 2023-09-12 DIAGNOSIS — M6281 Muscle weakness (generalized): Secondary | ICD-10-CM | POA: Diagnosis not present
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
## 2023-09-14 ENCOUNTER — Ambulatory Visit: Payer: BC Managed Care – PPO | Admitting: Physical Therapy

## 2023-09-14 ENCOUNTER — Encounter: Payer: Self-pay | Admitting: Physical Therapy

## 2023-09-14 DIAGNOSIS — M6281 Muscle weakness (generalized): Secondary | ICD-10-CM

## 2023-09-14 DIAGNOSIS — M25511 Pain in right shoulder: Secondary | ICD-10-CM | POA: Diagnosis not present

## 2023-09-14 DIAGNOSIS — M25611 Stiffness of right shoulder, not elsewhere classified: Secondary | ICD-10-CM
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
## 2023-09-17 ENCOUNTER — Ambulatory Visit: Payer: BC Managed Care – PPO | Admitting: Family Medicine

## 2023-09-17 ENCOUNTER — Encounter: Payer: Self-pay | Admitting: Family Medicine

## 2023-09-17 VITALS — BP 132/90 | HR 66 | Temp 98.4°F | Ht 69.0 in | Wt 238.4 lb

## 2023-09-17 DIAGNOSIS — M25571 Pain in right ankle and joints of right foot: Secondary | ICD-10-CM | POA: Diagnosis not present

## 2023-09-17 NOTE — Progress Notes (Signed)
Established Patient Office Visit   Subjective:  Patient ID: Richard Reynolds, male    DOB: [birth year]  Age: 52 y.o. MRN: [id number]  Chief Complaint  Patient presents with   Follow-up    27 days f/u. Discuss is he able to go back to work. Pt needing a note saying how long he will be out of work.    HPI Encounter Diagnoses  Name Primary?   Right ankle pain, unspecified chronicity Yes   Rib fractures continue to heal.  The ankle is doing better.  He is working with physical therapy.  He is now seeing an orthopedic trauma specialist for follow-up.   Review of Systems  Constitutional: Negative.   HENT: Negative.    Eyes:  Negative for blurred vision, discharge and redness.  Respiratory: Negative.    Cardiovascular: Negative.   Gastrointestinal:  Negative for abdominal pain.  Genitourinary: Negative.   Musculoskeletal:  Positive for joint pain. Negative for myalgias.  Skin:  Negative for rash.  Neurological:  Negative for tingling, loss of consciousness and weakness.  Endo/Heme/Allergies:  Negative for polydipsia.     Current Outpatient Medications:    acetaminophen (TYLENOL) 500 MG tablet, Take 2 tablets (1,000 mg total) by mouth every 8 (eight) hours as needed., Disp: , Rfl:    metFORMIN (GLUCOPHAGE-XR) 500 MG 24 hr tablet, Take 1 tablet (500 mg total) by mouth at bedtime., Disp: 90 tablet, Rfl: 1   metFORMIN (GLUCOPHAGE-XR) 500 MG 24 hr tablet, Take 500 mg by mouth daily., Disp: , Rfl:    traZODone (DESYREL) 100 MG tablet, May take 50-100 mg nightly as needed for sleep., Disp: 90 tablet, Rfl: 0   traZODone (DESYREL) 100 MG tablet, Take 50-100 mg by mouth at bedtime as needed for sleep., Disp: , Rfl:    methocarbamol (ROBAXIN) 500 MG tablet, Take 2 tablets (1,000 mg total) by mouth every 8 (eight) hours as needed for muscle spasms. (Patient not taking: Reported on 09/17/2023), Disp: 60 tablet, Rfl: 0   oxyCODONE (OXY IR/ROXICODONE) 5 MG immediate release tablet, Take 1-2  tablets (5-10 mg total) by mouth every 6 (six) hours as needed for breakthrough pain. (Patient not taking: Reported on 09/17/2023), Disp: 20 tablet, Rfl: 0   polyethylene glycol (MIRALAX / GLYCOLAX) 17 g packet, Take 17 g by mouth 2 (two) times daily as needed. (Patient not taking: Reported on 09/17/2023), Disp: , Rfl:    Objective:     BP (!) 132/90 (BP Location: Left Arm, Patient Position: Sitting, Cuff Size: Normal)   Pulse 66   Temp 98.4 F (36.9 C) (Temporal)   Ht 5\' 9"  (1.753 m)   Wt 238 lb 6.4 oz (108.1 kg)   SpO2 97%   BMI 35.21 kg/m    Physical Exam Constitutional:      General: He is not in acute distress.    Appearance: Normal appearance. He is not ill-appearing, toxic-appearing or diaphoretic.  HENT:     Head: Normocephalic and atraumatic.     Right Ear: External ear normal.     Left Ear: External ear normal.  Eyes:     General: No scleral icterus.       Right eye: No discharge.        Left eye: No discharge.     Extraocular Movements: Extraocular movements intact.     Conjunctiva/sclera: Conjunctivae normal.  Pulmonary:     Effort: Pulmonary effort is normal. No respiratory distress.  Skin:    General: Skin is warm and  dry.  Neurological:     Mental Status: He is alert and oriented to person, place, and time.  Psychiatric:        Mood and Affect: Mood normal.        Behavior: Behavior normal.      No results found for any visits on 09/17/23.    The 10-year ASCVD risk score (Arnett DK, et al., 2019) is: 3.2%    Assessment & Plan:   Right ankle pain, unspecified chronicity    Return Follow-up with orthopedic surgeon on 10/22..  Out of work through 10/22.  Mliss Sax, MD

## 2023-09-18 ENCOUNTER — Ambulatory Visit: Payer: BC Managed Care – PPO | Admitting: Family Medicine
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
## 2023-09-19 ENCOUNTER — Ambulatory Visit: Payer: BC Managed Care – PPO

## 2023-09-19 DIAGNOSIS — M25611 Stiffness of right shoulder, not elsewhere classified: Secondary | ICD-10-CM

## 2023-09-19 DIAGNOSIS — S2241XD Multiple fractures of ribs, right side, subsequent encounter for fracture with routine healing: Secondary | ICD-10-CM

## 2023-09-19 DIAGNOSIS — M25511 Pain in right shoulder: Secondary | ICD-10-CM

## 2023-09-19 DIAGNOSIS — M6281 Muscle weakness (generalized): Secondary | ICD-10-CM

## 2023-09-21 ENCOUNTER — Ambulatory Visit: Payer: BC Managed Care – PPO

## 2023-09-25 ENCOUNTER — Ambulatory Visit: Payer: BC Managed Care – PPO | Admitting: Physical Therapy

## 2023-09-25 ENCOUNTER — Encounter: Payer: Self-pay | Admitting: Physical Therapy

## 2023-09-25 DIAGNOSIS — M25611 Stiffness of right shoulder, not elsewhere classified: Secondary | ICD-10-CM

## 2023-09-25 DIAGNOSIS — M6281 Muscle weakness (generalized): Secondary | ICD-10-CM

## 2023-09-25 DIAGNOSIS — S2241XD Multiple fractures of ribs, right side, subsequent encounter for fracture with routine healing: Secondary | ICD-10-CM

## 2023-09-25 DIAGNOSIS — M25511 Pain in right shoulder: Secondary | ICD-10-CM
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
## 2023-09-28 ENCOUNTER — Ambulatory Visit: Payer: BC Managed Care – PPO
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
## 2023-10-05 ENCOUNTER — Ambulatory Visit: Payer: BC Managed Care – PPO | Attending: Physician Assistant

## 2023-10-05 DIAGNOSIS — S2241XD Multiple fractures of ribs, right side, subsequent encounter for fracture with routine healing: Secondary | ICD-10-CM | POA: Insufficient documentation

## 2023-10-05 DIAGNOSIS — M25611 Stiffness of right shoulder, not elsewhere classified: Secondary | ICD-10-CM | POA: Insufficient documentation

## 2023-10-05 DIAGNOSIS — M25511 Pain in right shoulder: Secondary | ICD-10-CM | POA: Diagnosis not present

## 2023-10-05 DIAGNOSIS — M6281 Muscle weakness (generalized): Secondary | ICD-10-CM | POA: Diagnosis present

## 2023-10-10 ENCOUNTER — Other Ambulatory Visit: Payer: Self-pay

## 2023-10-10 ENCOUNTER — Ambulatory Visit: Payer: BC Managed Care – PPO

## 2023-10-10 DIAGNOSIS — M25611 Stiffness of right shoulder, not elsewhere classified: Secondary | ICD-10-CM | POA: Diagnosis not present

## 2023-10-10 DIAGNOSIS — S2241XD Multiple fractures of ribs, right side, subsequent encounter for fracture with routine healing: Secondary | ICD-10-CM

## 2023-10-10 DIAGNOSIS — M25511 Pain in right shoulder: Secondary | ICD-10-CM

## 2023-10-10 DIAGNOSIS — M6281 Muscle weakness (generalized): Secondary | ICD-10-CM
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
## 2023-10-15 ENCOUNTER — Ambulatory Visit: Payer: BC Managed Care – PPO | Admitting: Physical Therapy

## 2023-10-15 ENCOUNTER — Encounter: Payer: Self-pay | Admitting: Physical Therapy

## 2023-10-15 ENCOUNTER — Encounter: Payer: Self-pay | Admitting: Family Medicine

## 2023-10-15 ENCOUNTER — Ambulatory Visit: Payer: BC Managed Care – PPO | Admitting: Family Medicine

## 2023-10-15 VITALS — BP 138/82 | HR 69 | Temp 98.6°F | Ht 69.0 in | Wt 241.4 lb

## 2023-10-15 DIAGNOSIS — M25511 Pain in right shoulder: Secondary | ICD-10-CM

## 2023-10-15 DIAGNOSIS — E7800 Pure hypercholesterolemia, unspecified: Secondary | ICD-10-CM | POA: Diagnosis not present

## 2023-10-15 DIAGNOSIS — M6281 Muscle weakness (generalized): Secondary | ICD-10-CM

## 2023-10-15 DIAGNOSIS — Z6835 Body mass index (BMI) 35.0-35.9, adult: Secondary | ICD-10-CM | POA: Diagnosis not present

## 2023-10-15 DIAGNOSIS — M25611 Stiffness of right shoulder, not elsewhere classified: Secondary | ICD-10-CM

## 2023-10-15 DIAGNOSIS — R7303 Prediabetes: Secondary | ICD-10-CM | POA: Diagnosis not present
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
## 2023-10-16 LAB — HEMOGLOBIN A1C: Hgb A1c MFr Bld: 6 % (ref 4.6–6.5)

## 2023-10-19 ENCOUNTER — Encounter: Payer: Self-pay | Admitting: Physical Therapy

## 2023-10-19 ENCOUNTER — Ambulatory Visit: Payer: BC Managed Care – PPO | Admitting: Physical Therapy

## 2023-10-19 DIAGNOSIS — M25511 Pain in right shoulder: Secondary | ICD-10-CM

## 2023-10-19 DIAGNOSIS — M6281 Muscle weakness (generalized): Secondary | ICD-10-CM

## 2023-10-19 DIAGNOSIS — M25611 Stiffness of right shoulder, not elsewhere classified: Secondary | ICD-10-CM
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
## 2023-10-23 ENCOUNTER — Ambulatory Visit: Payer: BC Managed Care – PPO | Admitting: Physical Therapy

## 2023-10-23 ENCOUNTER — Encounter: Payer: Self-pay | Admitting: Physical Therapy

## 2023-10-23 DIAGNOSIS — M25611 Stiffness of right shoulder, not elsewhere classified: Secondary | ICD-10-CM | POA: Diagnosis not present

## 2023-10-23 DIAGNOSIS — M6281 Muscle weakness (generalized): Secondary | ICD-10-CM
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
## 2023-10-24 ENCOUNTER — Ambulatory Visit: Payer: BC Managed Care – PPO | Admitting: Physical Therapy

## 2023-10-30 ENCOUNTER — Encounter: Payer: Self-pay | Admitting: Physical Therapy

## 2023-10-30 ENCOUNTER — Ambulatory Visit: Payer: BC Managed Care – PPO | Admitting: Physical Therapy

## 2023-10-30 DIAGNOSIS — M6281 Muscle weakness (generalized): Secondary | ICD-10-CM

## 2023-10-30 DIAGNOSIS — M25611 Stiffness of right shoulder, not elsewhere classified: Secondary | ICD-10-CM | POA: Diagnosis not present

## 2023-10-30 DIAGNOSIS — S2241XD Multiple fractures of ribs, right side, subsequent encounter for fracture with routine healing: Secondary | ICD-10-CM

## 2023-10-30 DIAGNOSIS — M25511 Pain in right shoulder: Secondary | ICD-10-CM
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
## 2023-10-31 ENCOUNTER — Ambulatory Visit: Payer: BC Managed Care – PPO | Admitting: Physical Therapy

## 2023-11-06 ENCOUNTER — Ambulatory Visit: Payer: BC Managed Care – PPO | Attending: Physician Assistant

## 2023-11-06 DIAGNOSIS — M25611 Stiffness of right shoulder, not elsewhere classified: Secondary | ICD-10-CM | POA: Insufficient documentation

## 2023-11-06 DIAGNOSIS — M6281 Muscle weakness (generalized): Secondary | ICD-10-CM | POA: Insufficient documentation

## 2023-11-06 DIAGNOSIS — S2241XD Multiple fractures of ribs, right side, subsequent encounter for fracture with routine healing: Secondary | ICD-10-CM

## 2023-11-06 DIAGNOSIS — S42101A Fracture of unspecified part of scapula, right shoulder, initial encounter for closed fracture: Secondary | ICD-10-CM | POA: Diagnosis not present

## 2023-11-06 DIAGNOSIS — M25511 Pain in right shoulder: Secondary | ICD-10-CM | POA: Insufficient documentation

## 2023-11-06 DIAGNOSIS — S2243XA Multiple fractures of ribs, bilateral, initial encounter for closed fracture: Secondary | ICD-10-CM | POA: Insufficient documentation
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
## 2023-11-08 ENCOUNTER — Ambulatory Visit: Payer: BC Managed Care – PPO

## 2023-11-08 DIAGNOSIS — M25611 Stiffness of right shoulder, not elsewhere classified: Secondary | ICD-10-CM

## 2023-11-08 DIAGNOSIS — S2241XD Multiple fractures of ribs, right side, subsequent encounter for fracture with routine healing: Secondary | ICD-10-CM

## 2023-11-08 DIAGNOSIS — M6281 Muscle weakness (generalized): Secondary | ICD-10-CM

## 2023-11-08 DIAGNOSIS — M25511 Pain in right shoulder: Secondary | ICD-10-CM

## 2023-11-09 ENCOUNTER — Other Ambulatory Visit: Payer: Self-pay | Admitting: Family Medicine

## 2023-11-09 DIAGNOSIS — F5101 Primary insomnia: Secondary | ICD-10-CM

## 2023-11-13 ENCOUNTER — Ambulatory Visit: Payer: BC Managed Care – PPO | Admitting: Physical Therapy

## 2023-11-13 ENCOUNTER — Encounter: Payer: Self-pay | Admitting: Physical Therapy

## 2023-11-13 DIAGNOSIS — M25611 Stiffness of right shoulder, not elsewhere classified: Secondary | ICD-10-CM

## 2023-11-13 DIAGNOSIS — M25511 Pain in right shoulder: Secondary | ICD-10-CM
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
## 2023-11-19 ENCOUNTER — Other Ambulatory Visit: Payer: Self-pay | Admitting: Family Medicine

## 2023-11-19 DIAGNOSIS — F5101 Primary insomnia: Secondary | ICD-10-CM

## 2023-11-20 ENCOUNTER — Ambulatory Visit: Payer: BC Managed Care – PPO | Admitting: Physical Therapy

## 2023-11-20 ENCOUNTER — Encounter: Payer: Self-pay | Admitting: Physical Therapy

## 2023-11-20 DIAGNOSIS — S2241XD Multiple fractures of ribs, right side, subsequent encounter for fracture with routine healing: Secondary | ICD-10-CM

## 2023-11-20 DIAGNOSIS — M25611 Stiffness of right shoulder, not elsewhere classified: Secondary | ICD-10-CM | POA: Diagnosis not present

## 2023-11-20 DIAGNOSIS — M25511 Pain in right shoulder: Secondary | ICD-10-CM

## 2023-11-20 DIAGNOSIS — M6281 Muscle weakness (generalized): Secondary | ICD-10-CM
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
## 2024-01-10 ENCOUNTER — Other Ambulatory Visit: Payer: Self-pay | Admitting: Family Medicine

## 2024-01-10 DIAGNOSIS — R7303 Prediabetes: Secondary | ICD-10-CM

## 2024-02-19 ENCOUNTER — Other Ambulatory Visit: Payer: Self-pay | Admitting: Family Medicine

## 2024-02-19 DIAGNOSIS — F5101 Primary insomnia: Secondary | ICD-10-CM

## 2024-02-19 NOTE — Telephone Encounter (Signed)
 Copied from CRM [id number]. Topic: Clinical - Medication Refill >> Feb 19, 2024  4:22 PM Truddie Crumble wrote: Most Recent Primary Care Visit:  Provider: Mliss Sax  Department: [facility]  Visit Type: OFFICE VISIT  Date: 10/15/2023  Medication: traZODone (DESYREL) 100 MG tablet  Has the patient contacted their pharmacy? Yes (Agent: If no, request that the patient contact the pharmacy for the refill. If patient does not wish to contact the pharmacy document the reason why and proceed with request.) (Agent: If yes, when and what did the pharmacy advise?)  Is this the correct pharmacy for this prescription? Yes If no, delete pharmacy and type the correct one.  This is the patient's preferred pharmacy:  [id number] [place], [place] - [address] AT [address] [place] [address] Phone: [phone] Fax: [phone]  Has the prescription been filled recently? No  Is the patient out of the medication? Yes  Has the patient been seen for an appointment in the last year OR does the patient have an upcoming appointment? Yes  Can we respond through MyChart? Yes  Agent: Please be advised that Rx refills may take up to 3 business days. We ask that you follow-up with your pharmacy.

## 2024-02-21 ENCOUNTER — Other Ambulatory Visit: Payer: Self-pay | Admitting: Family Medicine

## 2024-02-21 DIAGNOSIS — F5101 Primary insomnia: Secondary | ICD-10-CM

## 2024-04-17 ENCOUNTER — Encounter: Payer: Self-pay | Admitting: Family Medicine

## 2024-04-17 ENCOUNTER — Ambulatory Visit: Payer: BC Managed Care – PPO | Admitting: Family Medicine

## 2024-04-17 VITALS — BP 136/84 | HR 85 | Temp 97.9°F | Ht 69.0 in | Wt 241.6 lb

## 2024-04-17 DIAGNOSIS — Z6835 Body mass index (BMI) 35.0-35.9, adult: Secondary | ICD-10-CM | POA: Diagnosis not present

## 2024-04-17 DIAGNOSIS — Z1211 Encounter for screening for malignant neoplasm of colon: Secondary | ICD-10-CM

## 2024-04-17 DIAGNOSIS — F5101 Primary insomnia: Secondary | ICD-10-CM | POA: Diagnosis not present

## 2024-04-17 DIAGNOSIS — R7303 Prediabetes: Secondary | ICD-10-CM

## 2024-04-17 MED ORDER — TRAZODONE HCL 100 MG PO TABS: ORAL_TABLET | ORAL | 0 refills | Status: DC
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
## 2024-04-18 ENCOUNTER — Ambulatory Visit: Payer: Self-pay | Admitting: Family Medicine

## 2024-04-18 LAB — BASIC METABOLIC PANEL WITH GFR
BUN: 18 mg/dL (ref 6–23)
CO2: 30 meq/L (ref 19–32)
Calcium: 9.3 mg/dL (ref 8.4–10.5)
Chloride: 100 meq/L (ref 96–112)
Creatinine, Ser: 0.97 mg/dL (ref 0.40–1.50)
GFR: 90.99 mL/min (ref >60.00)
Glucose, Bld: 90 mg/dL (ref 70–99)
Potassium: 4.3 meq/L (ref 3.5–5.1)
Sodium: 139 meq/L (ref 135–145)

## 2024-04-18 LAB — HEMOGLOBIN A1C: Hgb A1c MFr Bld: 6.4 % (ref 4.6–6.5)

## 2024-04-18 MED ORDER — METFORMIN HCL ER 500 MG PO TB24: 500.0000 mg | ORAL_TABLET | Freq: Two times a day (BID) | ORAL | 1 refills | Status: DC

## 2024-04-18 NOTE — Addendum Note (Signed)
 Addended by: Delene Feinstein on: 04/18/2024 12:15 PM   Modules accepted: Orders

## 2024-05-07 ENCOUNTER — Encounter: Payer: Self-pay | Admitting: Gastroenterology

## 2024-05-28 ENCOUNTER — Encounter: Payer: Self-pay | Admitting: Gastroenterology

## 2024-05-28 ENCOUNTER — Ambulatory Visit (AMBULATORY_SURGERY_CENTER)

## 2024-05-28 VITALS — Ht 69.0 in | Wt 233.0 lb

## 2024-05-28 DIAGNOSIS — Z1211 Encounter for screening for malignant neoplasm of colon: Secondary | ICD-10-CM

## 2024-05-28 MED ORDER — NA SULFATE-K SULFATE-MG SULF 17.5-3.13-1.6 GM/177ML PO SOLN
1.0000 | Freq: Once | ORAL | 0 refills | Status: AC
Start: 2024-05-28 — End: 2024-05-28

## 2024-05-28 NOTE — Progress Notes (Signed)

## 2024-06-27 ENCOUNTER — Ambulatory Visit: Admitting: Gastroenterology

## 2024-06-27 ENCOUNTER — Encounter: Payer: Self-pay | Admitting: Gastroenterology

## 2024-06-27 VITALS — BP 111/83 | HR 62 | Temp 98.1°F | Resp 16 | Ht 69.0 in | Wt 233.0 lb

## 2024-06-27 DIAGNOSIS — Z1211 Encounter for screening for malignant neoplasm of colon: Secondary | ICD-10-CM | POA: Diagnosis present

## 2024-06-27 DIAGNOSIS — D125 Benign neoplasm of sigmoid colon: Secondary | ICD-10-CM

## 2024-06-27 DIAGNOSIS — K640 First degree hemorrhoids: Secondary | ICD-10-CM | POA: Diagnosis not present

## 2024-06-27 DIAGNOSIS — K6389 Other specified diseases of intestine: Secondary | ICD-10-CM | POA: Diagnosis not present

## 2024-06-27 DIAGNOSIS — K635 Polyp of colon: Secondary | ICD-10-CM | POA: Diagnosis not present

## 2024-06-27 MED ORDER — SODIUM CHLORIDE 0.9 % IV SOLN: 500.0000 mL | Freq: Once | INTRAVENOUS | Status: DC

## 2024-06-27 NOTE — Patient Instructions (Signed)
 Handout on polyps and hemorrhoids given to patient Await pathology results Resume previous diet and continue present medications Repeat colonoscopy for surveillance will be determined based off of pathology results    YOU HAD AN ENDOSCOPIC PROCEDURE TODAY AT THE [facility]:   Refer to the procedure report that was given to you for any specific questions about what was found during the examination.  If the procedure report does not answer your questions, please call your gastroenterologist to clarify.  If you requested that your care partner not be given the details of your procedure findings, then the procedure report has been included in a sealed envelope for you to review at your convenience later.  YOU SHOULD EXPECT: Some feelings of bloating in the abdomen. Passage of more gas than usual.  Walking can help get rid of the air that was put into your GI tract during the procedure and reduce the bloating. If you had a lower endoscopy (such as a colonoscopy or flexible sigmoidoscopy) you may notice spotting of blood in your stool or on the toilet paper. If you underwent a bowel prep for your procedure, you may not have a normal bowel movement for a few days.  Please Note:  You might notice some irritation and congestion in your nose or some drainage.  This is from the oxygen used during your procedure.  There is no need for concern and it should clear up in a day or so.  SYMPTOMS TO REPORT IMMEDIATELY:  Following lower endoscopy (colonoscopy or flexible sigmoidoscopy):  Excessive amounts of blood in the stool  Significant tenderness or worsening of abdominal pains  Swelling of the abdomen that is new, acute  Fever of 100F or higher  For urgent or emergent issues, a gastroenterologist can be reached at any hour by calling [phone]. Do not use MyChart messaging for urgent concerns.    DIET:  We do recommend a small meal at first, but then you may proceed to your regular  diet.  Drink plenty of fluids but you should avoid alcoholic beverages for 24 hours.  ACTIVITY:  You should plan to take it easy for the rest of today and you should NOT DRIVE or use heavy machinery until tomorrow (because of the sedation medicines used during the test).    FOLLOW UP: Our staff will call the number listed on your records the next business day following your procedure.  We will call around 7:15- 8:00 am to check on you and address any questions or concerns that you may have regarding the information given to you following your procedure. If we do not reach you, we will leave a message.     If any biopsies were taken you will be contacted by phone or by letter within the next 1-3 weeks.  Please call us at [phone] if you have not heard about the biopsies in 3 weeks.    SIGNATURES/CONFIDENTIALITY: You and/or your care partner have signed paperwork which will be entered into your electronic medical record.  These signatures attest to the fact that that the information above on your After Visit Summary has been reviewed and is understood.  Full responsibility of the confidentiality of this discharge information lies with you and/or your care-partner.

## 2024-06-27 NOTE — Progress Notes (Signed)
 [facility] History and Physical   Primary Care Physician:  Berneta Elsie Sayre, MD   Reason for Procedure:   CRC screening  Plan:    colon     HPI: Richard Reynolds is a 53 y.o. 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 Authorizing Provider  metFORMIN  (GLUCOPHAGE -XR) 500 MG 24 hr tablet Take 1 tablet (500 mg total) by mouth 2 (two) times daily with a meal. 04/18/24  Yes Berneta Elsie Sayre, MD  traZODone  (DESYREL ) 100 MG tablet MAY TAKE 1/2 TO 1 TABLET BY MOUTH NIGHTLY AS NEEDED FOR SLEEP 04/17/24  Yes Berneta Elsie Sayre, MD    Current Outpatient Medications  Medication Sig Dispense Refill   metFORMIN  (GLUCOPHAGE -XR) 500 MG 24 hr tablet Take 1 tablet (500 mg total) by mouth 2 (two) times daily with a meal. 180 tablet 1   traZODone  (DESYREL ) 100 MG tablet MAY TAKE 1/2 TO 1 TABLET BY MOUTH NIGHTLY AS NEEDED FOR SLEEP 90 tablet 0   Current Facility-Administered Medications  Medication Dose Route Frequency Provider Last Rate Last Admin   0.9 %  sodium chloride  infusion  500 mL Intravenous Once Charlanne Groom, MD        Allergies as of 06/27/2024   (No Known Allergies)    Family History  Problem Relation Age of Onset   Colon cancer Neg Hx    Rectal cancer Neg Hx    Stomach cancer Neg Hx     Social History   Socioeconomic History   Marital status: Married    Spouse  name: Not on file   Number of children: Not on file   Years of education: Not on file   Highest education level: Not on file  Occupational History   Not on file  Tobacco Use   Smoking status: Never   Smokeless tobacco: Never  Vaping Use   Vaping status: Never Used  Substance and Sexual Activity   Alcohol use: Yes    Comment: occassional beers on the weekends.   Drug use: Never   Sexual activity: Yes  Other Topics Concern   Not on file  Social History Narrative   ** Merged History Encounter **       Social Drivers of Health   Financial Resource Strain: Not on file  Food Insecurity: No Food Insecurity (08/10/2023)   Hunger Vital Sign    Worried About Running Out of Food in the Last Year: Never true    Ran Out of Food in the Last Year: Never true  Transportation Needs: No Transportation Needs (08/10/2023)   PRAPARE - Administrator, Civil Service (Medical): No    Lack of Transportation (Non-Medical): No  Physical Activity: Not on file  Stress: Not on file  Social Connections: Not on file  Intimate Partner Violence: Not At Risk (08/10/2023)   Humiliation, Afraid,  Rape, and Kick questionnaire    Fear of Current or Ex-Partner: No    Emotionally Abused: No    Physically Abused: No    Sexually Abused: No    Review of Systems: Positive for none All other review of systems negative except as mentioned in the HPI.  Physical Exam: Vital signs in last 24 hours: @VSRANGES @   General:   Alert,  Well-developed, well-nourished, pleasant and cooperative in NAD Lungs:  Clear throughout to auscultation.   Heart:  Regular rate and rhythm; no murmurs, clicks, rubs,  or gallops. Abdomen:  Soft, nontender and nondistended. Normal bowel sounds.   Neuro/Psych:  Alert and cooperative. Normal mood and affect. A and O x 3    No significant changes were identified.  The patient continues to be an appropriate candidate for the planned procedure and anesthesia.   Anselm Bring,  MD. [facility] 06/27/2024 11:20 AM@

## 2024-06-27 NOTE — Op Note (Signed)
 [facility] Patient Name: Richard Reynolds Procedure Date: 06/27/2024 11:24 AM MRN: [id number] Endoscopist: Lynnie Bring , MD, [id number] Age: 53 Referring MD:  Date of Birth: June 27, 1971 Gender: Male Account #: [id number] Procedure:                Colonoscopy Indications:              Screening for colorectal malignant neoplasm Medicines:                Monitored Anesthesia Care Procedure:                Pre-Anesthesia Assessment:                           - Prior to the procedure, a History and Physical                            was performed, and patient medications and                            allergies were reviewed. The patient's tolerance of                            previous anesthesia was also reviewed. The risks                            and benefits of the procedure and the sedation                            options and risks were discussed with the patient.                            All questions were answered, and informed consent                            was obtained. Prior Anticoagulants: The patient has                            taken no anticoagulant or antiplatelet agents. ASA                            Grade Assessment: II - A patient with mild systemic                            disease. After reviewing the risks and benefits,                            the patient was deemed in satisfactory condition to                            undergo the procedure.                           After obtaining informed consent, the colonoscope  was passed under direct vision. Throughout the                            procedure, the patient's blood pressure, pulse, and                            oxygen saturations were monitored continuously. The                            Olympus CF-HQ190L ([id number]) Colonoscope was                            introduced through the anus and advanced to the 2                            cm into the ileum.  The colonoscopy was performed                            without difficulty. The patient tolerated the                            procedure well. The quality of the bowel                            preparation was good. The terminal ileum, ileocecal                            valve, appendiceal orifice, and rectum were                            photographed. Scope In: 11:30:53 AM Scope Out: 11:40:41 AM Scope Withdrawal Time: 0 hours 8 minutes 4 seconds  Total Procedure Duration: 0 hours 9 minutes 48 seconds  Findings:                 A 6 mm polyp was found in the mid sigmoid colon.                            The polyp was sessile. The polyp was removed with a                            cold snare. Resection and retrieval were complete.                            Estimated blood loss: none.                           Non-bleeding internal hemorrhoids were found during                            retroflexion. The hemorrhoids were small and Grade                            I (internal hemorrhoids that do not prolapse).  The terminal ileum appeared normal.                           The exam was otherwise without abnormality on                            direct and retroflexion views. Complications:            No immediate complications. Estimated Blood Loss:     Estimated blood loss: none. Impression:               - One 6 mm polyp in the mid sigmoid colon, removed                            with a cold snare. Resected and retrieved.                           - Non-bleeding internal hemorrhoids.                           - The examined portion of the ileum was normal.                           - The examination was otherwise normal on direct                            and retroflexion views. Recommendation:           - Patient has a contact number available for                            emergencies. The signs and symptoms of potential                             delayed complications were discussed with the                            patient. Return to normal activities tomorrow.                            Written discharge instructions were provided to the                            patient.                           - Resume previous diet.                           - Continue present medications.                           - Await pathology results.                           - Repeat colonoscopy for surveillance based on  pathology results.                           - The findings and recommendations were discussed                            with the patient's family. Lynnie Bring, MD 06/27/2024 11:43:59 AM This report has been signed electronically.

## 2024-06-27 NOTE — Progress Notes (Signed)
 Pt's states no medical or surgical changes since previsit or office visit.

## 2024-06-27 NOTE — Progress Notes (Signed)
 A/o x 3, VSS, gd SR's, pleased with anesthesia, report to RN

## 2024-06-27 NOTE — Progress Notes (Signed)
 Called to room to assist during endoscopic procedure.  Patient ID and intended procedure confirmed with present staff. Received instructions for my participation in the procedure from the performing physician.

## 2024-06-30 ENCOUNTER — Telehealth: Payer: Self-pay | Admitting: Lactation Services

## 2024-06-30 NOTE — Telephone Encounter (Signed)
 No answer left voice mail

## 2024-07-01 LAB — SURGICAL PATHOLOGY

## 2024-07-03 ENCOUNTER — Ambulatory Visit: Payer: Self-pay | Admitting: Gastroenterology

## 2024-07-24 ENCOUNTER — Encounter: Payer: Self-pay | Admitting: Family Medicine

## 2024-07-24 ENCOUNTER — Ambulatory Visit (INDEPENDENT_AMBULATORY_CARE_PROVIDER_SITE_OTHER): Admitting: Family Medicine

## 2024-07-24 ENCOUNTER — Encounter: Admitting: Family Medicine

## 2024-07-24 VITALS — BP 128/76 | HR 76 | Temp 98.6°F | Ht 69.0 in | Wt 238.2 lb

## 2024-07-24 DIAGNOSIS — R7303 Prediabetes: Secondary | ICD-10-CM

## 2024-07-24 DIAGNOSIS — Z6835 Body mass index (BMI) 35.0-35.9, adult: Secondary | ICD-10-CM

## 2024-07-24 DIAGNOSIS — E7800 Pure hypercholesterolemia, unspecified: Secondary | ICD-10-CM

## 2024-07-24 DIAGNOSIS — Z23 Encounter for immunization: Secondary | ICD-10-CM

## 2024-07-24 DIAGNOSIS — Z0000 Encounter for general adult medical examination without abnormal findings: Secondary | ICD-10-CM | POA: Diagnosis not present

## 2024-07-24 DIAGNOSIS — Z125 Encounter for screening for malignant neoplasm of prostate: Secondary | ICD-10-CM

## 2024-07-24 LAB — CBC WITH DIFFERENTIAL/PLATELET
Basophils Absolute: 0 K/uL (ref 0.0–0.1)
Basophils Relative: 0.7 % (ref 0.0–3.0)
Eosinophils Absolute: 0.2 K/uL (ref 0.0–0.7)
Eosinophils Relative: 2.6 % (ref 0.0–5.0)
HCT: 44 % (ref 39.0–52.0)
Hemoglobin: 14.7 g/dL (ref 13.0–17.0)
Lymphocytes Relative: 27.1 % (ref 12.0–46.0)
Lymphs Abs: 1.7 K/uL (ref 0.7–4.0)
MCHC: 33.5 g/dL (ref 30.0–36.0)
MCV: 84.8 fl (ref 78.0–100.0)
Monocytes Absolute: 0.4 K/uL (ref 0.1–1.0)
Monocytes Relative: 6.7 % (ref 3.0–12.0)
Neutro Abs: 4 K/uL (ref 1.4–7.7)
Neutrophils Relative %: 62.9 % (ref 43.0–77.0)
Platelets: 242 K/uL (ref 150.0–400.0)
RBC: 5.18 Mil/uL (ref 4.22–5.81)
RDW: 13.6 % (ref 11.5–15.5)
WBC: 6.3 K/uL (ref 4.0–10.5)

## 2024-07-24 LAB — COMPREHENSIVE METABOLIC PANEL WITH GFR
ALT: 16 U/L (ref 0–53)
AST: 17 U/L (ref 0–37)
Albumin: 4.6 g/dL (ref 3.5–5.2)
Alkaline Phosphatase: 53 U/L (ref 39–117)
BUN: 19 mg/dL (ref 6–23)
CO2: 29 meq/L (ref 19–32)
Calcium: 9.3 mg/dL (ref 8.4–10.5)
Chloride: 100 meq/L (ref 96–112)
Creatinine, Ser: 0.87 mg/dL (ref 0.40–1.50)
GFR: 100.38 mL/min (ref >60.00)
Glucose, Bld: 90 mg/dL (ref 70–99)
Potassium: 4 meq/L (ref 3.5–5.1)
Sodium: 140 meq/L (ref 135–145)
Total Bilirubin: 0.6 mg/dL (ref 0.2–1.2)
Total Protein: 7.2 g/dL (ref 6.0–8.3)

## 2024-07-24 LAB — HEMOGLOBIN A1C: Hgb A1c MFr Bld: 6.7 % — ABNORMAL HIGH (ref 4.6–6.5)

## 2024-07-24 LAB — LIPID PANEL
Cholesterol: 193 mg/dL (ref 0–200)
HDL: 55.6 mg/dL (ref >39.00)
LDL Cholesterol: 120 mg/dL — ABNORMAL HIGH (ref 0–99)
NonHDL: 137.55
Total CHOL/HDL Ratio: 3
Triglycerides: 89 mg/dL (ref 0.0–149.0)
VLDL: 17.8 mg/dL (ref 0.0–40.0)

## 2024-07-24 LAB — URINALYSIS, ROUTINE W REFLEX MICROSCOPIC
Bilirubin Urine: NEGATIVE
Hgb urine dipstick: NEGATIVE
Ketones, ur: NEGATIVE
Leukocytes,Ua: NEGATIVE
Nitrite: NEGATIVE
Specific Gravity, Urine: 1.025 (ref 1.000–1.030)
Total Protein, Urine: NEGATIVE
Urine Glucose: NEGATIVE
Urobilinogen, UA: 0.2 (ref 0.0–1.0)
pH: 6 (ref 5.0–8.0)

## 2024-07-24 LAB — PSA: PSA: 1.49 ng/mL (ref 0.10–4.00)

## 2024-07-24 MED ORDER — TIRZEPATIDE-WEIGHT MANAGEMENT 2.5 MG/0.5ML ~~LOC~~ SOLN: 2.5000 mg | SUBCUTANEOUS | 3 refills | Status: DC

## 2024-07-24 NOTE — Progress Notes (Signed)
 Established Patient Office Visit   Subjective:  Patient ID: Richard Reynolds, male    DOB: 1971/01/29  Age: 53 y.o. MRN: [id number]  No chief complaint on file.   HPI Encounter Diagnoses  Name Primary?   Healthcare maintenance Yes   Immunization due    Elevated LDL cholesterol level    Prediabetes    BMI 35.0-35.9,adult    Screening for prostate cancer    For physical and follow-up of above.  Has been working hard to lose weight here over the summer.  He is walking 6 miles daily.  He has been on a calorie restricted diet.  He has regular dental care.  School starts on Monday with the kids.   Review of Systems  Constitutional: Negative.   HENT: Negative.    Eyes:  Negative for blurred vision, discharge and redness.  Respiratory: Negative.    Cardiovascular: Negative.   Gastrointestinal:  Negative for abdominal pain.  Genitourinary: Negative.   Musculoskeletal: Negative.  Negative for myalgias.  Skin:  Negative for rash.  Neurological:  Negative for tingling, loss of consciousness and weakness.  Endo/Heme/Allergies:  Negative for polydipsia.     Current Outpatient Medications:    tirzepatide  (ZEPBOUND ) 2.5 MG/0.5ML injection vial, Inject 2.5 mg into the skin once a week., Disp: 2 mL, Rfl: 3   metFORMIN  (GLUCOPHAGE -XR) 500 MG 24 hr tablet, Take 1 tablet (500 mg total) by mouth 2 (two) times daily with a meal., Disp: 180 tablet, Rfl: 1   traZODone  (DESYREL ) 100 MG tablet, MAY TAKE 1/2 TO 1 TABLET BY MOUTH NIGHTLY AS NEEDED FOR SLEEP, Disp: 90 tablet, Rfl: 0   Objective:     BP 128/76 (BP Location: Left Arm, Cuff Size: Normal)   Pulse 76   Temp 98.6 F (37 C) (Temporal)   Ht 5' 9 (1.753 m)   Wt 238 lb 3.2 oz (108 kg)   SpO2 97%   BMI 35.18 kg/m    Physical Exam Constitutional:      General: He is not in acute distress.    Appearance: Normal appearance. He is not ill-appearing, toxic-appearing or diaphoretic.  HENT:     Head: Normocephalic and atraumatic.      Right Ear: Tympanic membrane, ear canal and external ear normal.     Left Ear: Tympanic membrane, ear canal and external ear normal.     Mouth/Throat:     Mouth: Mucous membranes are moist.     Pharynx: Oropharynx is clear. No oropharyngeal exudate or posterior oropharyngeal erythema.  Eyes:     General: No scleral icterus.       Right eye: No discharge.        Left eye: No discharge.     Extraocular Movements: Extraocular movements intact.     Conjunctiva/sclera: Conjunctivae normal.     Pupils: Pupils are equal, round, and reactive to light.  Cardiovascular:     Rate and Rhythm: Normal rate and regular rhythm.  Pulmonary:     Effort: Pulmonary effort is normal. No respiratory distress.     Breath sounds: Normal breath sounds.  Abdominal:     General: Bowel sounds are normal.     Tenderness: There is no abdominal tenderness. There is no guarding.  Musculoskeletal:     Cervical back: No rigidity or tenderness.  Skin:    General: Skin is warm and dry.  Neurological:     Mental Status: He is alert and oriented to person, place, and time.  Psychiatric:  Mood and Affect: Mood normal.        Behavior: Behavior normal.      No results found for any visits on 07/24/24.    The 10-year ASCVD risk score (Arnett DK, et al., 2019) is: 3.3%    Assessment & Plan:   Healthcare maintenance -     CBC with Differential/Platelet -     Urinalysis, Routine w reflex microscopic  Immunization due -     Pneumococcal conjugate vaccine 20-valent -     Varicella-zoster vaccine IM  Elevated LDL cholesterol level -     Comprehensive metabolic panel with GFR -     Lipid panel  Prediabetes -     Comprehensive metabolic panel with GFR -     Hemoglobin A1c -     Tirzepatide -Weight Management; Inject 2.5 mg into the skin once a week.  Dispense: 2 mL; Refill: 3  BMI 35.0-35.9,adult -     Tirzepatide -Weight Management; Inject 2.5 mg into the skin once a week.  Dispense: 2 mL;  Refill: 3  Screening for prostate cancer -     PSA    Return in about 3 months (around 10/24/2024), or if symptoms worsen or fail to improve.  Information was given on health maintenance and disease prevention.  Continue exercising for 150 to 300 minutes weekly.  Will start Zepbound  at 2.5 mg weekly.  Discussed common side effects of nausea, constipation and/or diarrhea.  Discussed theoretical risk of thyroid  C cancer.  Continue weight training to prevent sarcopenia.  Severe abdominal pain needs emergency evaluation.  It could signify pancreatitis, intestinal obstruction or gallbladder disease.  Increase protein in the diet while limiting carbohydrates.  Information on Zepbound  given.  Second Shingrix  vaccine in 3 months  Elsie Sim Lent, MD

## 2024-07-25 NOTE — Telephone Encounter (Unsigned)
 Copied from CRM [id number]. Topic: Clinical - Medication Question >> Jul 25, 2024 11:23 AM Deleta RAMAN wrote: Reason for CRM: patient medication for weight loss is super expensive. He would like to know if he has to be on medication forever or until he drop the weight and begins maintaining. Please follow up with patient via phone. Patient aware of call back time

## 2024-07-28 ENCOUNTER — Ambulatory Visit: Payer: Self-pay | Admitting: Family

## 2024-07-28 ENCOUNTER — Telehealth: Payer: Self-pay

## 2024-07-28 NOTE — Telephone Encounter (Unsigned)
 Copied from CRM [id number]. Topic: General - Other >> Jul 25, 2024  4:26 PM Roselie BROCKS wrote: Reason for CRM: Patient calling to speak to clinic >> Jul 28, 2024  4:09 PM Thersia C wrote: Patient called in would like a nurse to give him a callback

## 2024-07-28 NOTE — Telephone Encounter (Signed)
 Copied from CRM [id number]. Topic: General - Other >> Jul 25, 2024  4:26 PM Roselie BROCKS wrote: Reason for CRM: Patient calling to speak to clinic >> Jul 28, 2024  4:09 PM Thersia C wrote: Patient called in would like a nurse to give him a callback

## 2024-07-28 NOTE — Telephone Encounter (Signed)
 Needs medication sent to [facility].  Will work on this tommorow. Dm/cma

## 2024-07-29 ENCOUNTER — Other Ambulatory Visit: Payer: Self-pay | Admitting: Family

## 2024-07-29 DIAGNOSIS — R7303 Prediabetes: Secondary | ICD-10-CM

## 2024-07-29 DIAGNOSIS — Z6835 Body mass index (BMI) 35.0-35.9, adult: Secondary | ICD-10-CM

## 2024-07-29 MED ORDER — TIRZEPATIDE-WEIGHT MANAGEMENT 2.5 MG/0.5ML ~~LOC~~ SOLN: 2.5000 mg | SUBCUTANEOUS | 3 refills | Status: DC

## 2024-07-29 NOTE — Telephone Encounter (Signed)
 Spoke to patient yesterday and advised that will have to send request to provider.  He states he need this sent to [facility] and that the RX needs to be the multidose vial.   Please review and advise.  Thanks .Dm/cma

## 2024-07-30 NOTE — Telephone Encounter (Signed)
 Patient notified VIA phone and will contact [employer] for payment/shipping details. Dm/cma

## 2024-08-11 ENCOUNTER — Other Ambulatory Visit: Payer: Self-pay | Admitting: Family Medicine

## 2024-08-11 DIAGNOSIS — F5101 Primary insomnia: Secondary | ICD-10-CM

## 2024-08-14 NOTE — Telephone Encounter (Unsigned)
 Copied from CRM #[id number]. Topic: Clinical - Medication Question >> Aug 13, 2024  4:30 PM Dedra B wrote: Reason for CRM: Pt wants to know if his trazodone  can be renewed. He is picking up his prescription today and has no refills left after this month.

## 2024-10-23 ENCOUNTER — Ambulatory Visit: Admitting: Family Medicine

## 2024-11-04 ENCOUNTER — Other Ambulatory Visit: Payer: Self-pay | Admitting: Family Medicine

## 2024-11-04 DIAGNOSIS — F5101 Primary insomnia: Secondary | ICD-10-CM

## 2024-11-04 NOTE — Telephone Encounter (Unsigned)
 Copied from CRM [id number]. Topic: Clinical - Medication Refill >> Nov 04, 2024  4:28 PM Ashley R wrote: Medication:  traZODone  (DESYREL ) 100 MG tablet   Has the patient contacted their pharmacy? Yes  This is the patient's preferred pharmacy:  [id number] - [place], [place] - [address] [facility] [address] [place] [address] Phone: [phone] Fax: [phone]  Is this the correct pharmacy for this prescription? Yes If no, delete pharmacy and type the correct one.   Has the prescription been filled recently? Yes  Is the patient out of the medication? No  Has the patient been seen for an appointment in the last year OR does the patient have an upcoming appointment? Yes  Can we respond through MyChart? No  Agent: Please be advised that Rx refills may take up to 3 business days. We ask that you follow-up with your pharmacy.

## 2024-11-06 ENCOUNTER — Other Ambulatory Visit: Payer: Self-pay | Admitting: Family

## 2024-11-06 DIAGNOSIS — R7303 Prediabetes: Secondary | ICD-10-CM

## 2024-11-06 DIAGNOSIS — Z6835 Body mass index (BMI) 35.0-35.9, adult: Secondary | ICD-10-CM

## 2024-11-06 NOTE — Telephone Encounter (Signed)
 Refill request received for FOV:11/13/2024 LOV:07/24/2024 Last refill:08/11/2024 Medication is pending your approval.

## 2024-11-11 NOTE — Telephone Encounter (Signed)
 Refill request received for Zepbound  2.5mg  FOV:11/13/2024 LOV:07/24/2024 Last refill:07/29/2024 Medication is pending your approval.

## 2024-11-13 ENCOUNTER — Encounter: Payer: Self-pay | Admitting: Family Medicine

## 2024-11-13 ENCOUNTER — Ambulatory Visit: Admitting: Family Medicine

## 2024-11-13 VITALS — BP 120/88 | HR 78 | Temp 97.8°F | Ht 69.0 in | Wt 219.2 lb

## 2024-11-13 DIAGNOSIS — R7303 Prediabetes: Secondary | ICD-10-CM

## 2024-11-13 DIAGNOSIS — Z23 Encounter for immunization: Secondary | ICD-10-CM

## 2024-11-13 DIAGNOSIS — Z6835 Body mass index (BMI) 35.0-35.9, adult: Secondary | ICD-10-CM

## 2024-11-13 DIAGNOSIS — F5101 Primary insomnia: Secondary | ICD-10-CM

## 2024-11-13 MED ORDER — METFORMIN HCL ER 500 MG PO TB24: 500.0000 mg | ORAL_TABLET | Freq: Two times a day (BID) | ORAL | 1 refills | Status: AC

## 2024-11-13 MED ORDER — TIRZEPATIDE-WEIGHT MANAGEMENT 5 MG/0.5ML ~~LOC~~ SOLN: 5.0000 mg | SUBCUTANEOUS | 5 refills | Status: AC

## 2024-11-13 MED ORDER — TRAZODONE HCL 100 MG PO TABS: ORAL_TABLET | ORAL | 3 refills | Status: AC

## 2024-11-13 NOTE — Progress Notes (Signed)
 It looks like he is getting a shingles vaccine to okay  Established Patient Office Visit   Subjective:  Patient ID: Richard Reynolds, male    DOB: 1971-03-20  Age: 54 y.o. MRN: [id number]  Chief Complaint  Patient presents with   Medical Management of Chronic Issues    3 mon f/u Pt states he needs zepbound  increased. Pat needs second shingles and refill for sleep medicine.     HPI Encounter Diagnoses  Name Primary?   Prediabetes Yes   BMI 35.0-35.9,adult    Need for shingles vaccine    Primary insomnia    Follow-up of above.  He is doing well with the Zepbound .  Was able to lose 18 pounds.  Occasional loose stools.  He has been exercising going to the gym for weight training.  Continues to take trazodone  for sleep.  Will have his second Shingrix  vaccine today.   ROS  Current Medications[1]   Objective:     BP 120/88   Pulse 78   Temp 97.8 F (36.6 C)   Ht 5' 9 (1.753 m)   Wt 219 lb 3.2 oz (99.4 kg)   SpO2 97%   BMI 32.37 kg/m  BP Readings from Last 3 Encounters:  11/13/24 120/88  07/24/24 128/76  06/27/24 111/83   Wt Readings from Last 3 Encounters:  11/13/24 219 lb 3.2 oz (99.4 kg)  07/24/24 238 lb 3.2 oz (108 kg)  06/27/24 233 lb (105.7 kg)      Physical Exam   No results found for any visits on 11/13/24.    The 10-year ASCVD risk score (Arnett DK, et al., 2019) is: 2.9%    Assessment & Plan:   Prediabetes -     Tirzepatide -Weight Management; Inject 5 mg into the skin once a week.  Dispense: 2 mL; Refill: 5 -     Basic metabolic panel with GFR -     Hemoglobin A1c -     metFORMIN  HCl ER; Take 1 tablet (500 mg total) by mouth 2 (two) times daily with a meal.  Dispense: 180 tablet; Refill: 1  BMI 35.0-35.9,adult -     Tirzepatide -Weight Management; Inject 5 mg into the skin once a week.  Dispense: 2 mL; Refill: 5  Need for shingles vaccine -     Varicella-zoster vaccine IM  Primary insomnia -     traZODone  HCl; TAKE 1/2 TO 1 TABLET BY  MOUTH NIGHTLY AS NEEDED  Dispense: 270 tablet; Refill: 3    Return in about 6 months (around 05/14/2025) for chronic disease follow-up, annual physical.  Continue increased dose of tirzepatide  at 5 mg.  Continue metformin .  Continue regular exercise and weight training.  Elsie Sim Lent, MD    [1]  Current Outpatient Medications:    tirzepatide  5 MG/0.5ML injection vial, Inject 5 mg into the skin once a week., Disp: 2 mL, Rfl: 5   metFORMIN  (GLUCOPHAGE -XR) 500 MG 24 hr tablet, Take 1 tablet (500 mg total) by mouth 2 (two) times daily with a meal., Disp: 180 tablet, Rfl: 1   traZODone  (DESYREL ) 100 MG tablet, TAKE 1/2 TO 1 TABLET BY MOUTH NIGHTLY AS NEEDED, Disp: 270 tablet, Rfl: 3

## 2024-11-14 LAB — BASIC METABOLIC PANEL WITH GFR
BUN: 18 mg/dL (ref 6–23)
CO2: 30 meq/L (ref 19–32)
Calcium: 9.8 mg/dL (ref 8.4–10.5)
Chloride: 99 meq/L (ref 96–112)
Creatinine, Ser: 0.94 mg/dL (ref 0.40–1.50)
GFR: 94.1 mL/min (ref >60.00)
Glucose, Bld: 87 mg/dL (ref 70–99)
Potassium: 4.5 meq/L (ref 3.5–5.1)
Sodium: 139 meq/L (ref 135–145)

## 2024-11-14 LAB — HEMOGLOBIN A1C: Hgb A1c MFr Bld: 5.9 % (ref 4.6–6.5)

## 2024-11-17 ENCOUNTER — Ambulatory Visit: Payer: Self-pay | Admitting: Family Medicine

## 2024-12-12 ENCOUNTER — Other Ambulatory Visit: Payer: Self-pay | Admitting: Family Medicine

## 2024-12-12 DIAGNOSIS — R7303 Prediabetes: Secondary | ICD-10-CM

## 2025-05-21 ENCOUNTER — Ambulatory Visit: Admitting: Family Medicine
